# Patient Record
Sex: Male | Born: 1935
Health system: Southern US, Community
[De-identification: ages and names within clinical notes are randomized; demographics above are authoritative.]

## PROBLEM LIST (undated history)

## (undated) DIAGNOSIS — E785 Hyperlipidemia, unspecified: Secondary | ICD-10-CM

## (undated) DIAGNOSIS — I2510 Atherosclerotic heart disease of native coronary artery without angina pectoris: Secondary | ICD-10-CM

## (undated) DIAGNOSIS — I219 Acute myocardial infarction, unspecified: Secondary | ICD-10-CM

## (undated) HISTORY — PX: HERNIA REPAIR: SHX51

## (undated) HISTORY — PX: CORONARY ARTERY BYPASS GRAFT: SHX141

## (undated) HISTORY — DX: Hyperlipidemia, unspecified: E78.5

## (undated) HISTORY — DX: Acute myocardial infarction, unspecified: I21.9

## (undated) HISTORY — DX: Atherosclerotic heart disease of native coronary artery without angina pectoris: I25.10

## (undated) HISTORY — PX: VALVE REPLACEMENT: SUR13

---

## 2004-09-15 HISTORY — PX: CORONARY ANGIOPLASTY: SHX604

## 2018-11-11 DIAGNOSIS — E7800 Pure hypercholesterolemia, unspecified: Secondary | ICD-10-CM | POA: Diagnosis present

## 2018-11-11 DIAGNOSIS — Z952 Presence of prosthetic heart valve: Secondary | ICD-10-CM

## 2018-11-11 DIAGNOSIS — K219 Gastro-esophageal reflux disease without esophagitis: Secondary | ICD-10-CM | POA: Diagnosis present

## 2018-12-01 NOTE — Progress Notes (Deleted)
Patient referred by Gaspar Garbe, MD for ***  Subjective:   Shaun Brown, male    DOB: [birth year], 85 y.o.   MRN: [id number]   No chief complaint on file.   *** HPI  85 y.o. *** male with ***  *** No past medical history on file.  *** *** The histories are not reviewed yet. Please review them in the "History" navigator section and refresh this SmartLink.  *** Social History   Socioeconomic History  . Marital status: Not on file    Spouse name: Not on file  . Number of children: Not on file  . Years of education: Not on file  . Highest education level: Not on file  Occupational History  . Not on file  Social Needs  . Financial resource strain: Not on file  . Food insecurity:    Worry: Not on file    Inability: Not on file  . Transportation needs:    Medical: Not on file    Non-medical: Not on file  Tobacco Use  . Smoking status: Not on file  Substance and Sexual Activity  . Alcohol use: Not on file  . Drug use: Not on file  . Sexual activity: Not on file  Lifestyle  . Physical activity:    Days per week: Not on file    Minutes per session: Not on file  . Stress: Not on file  Relationships  . Social connections:    Talks on phone: Not on file    Gets together: Not on file    Attends religious service: Not on file    Active member of club or organization: Not on file    Attends meetings of clubs or organizations: Not on file    Relationship status: Not on file  . Intimate partner violence:    Fear of current or ex partner: Not on file    Emotionally abused: Not on file    Physically abused: Not on file    Forced sexual activity: Not on file  Other Topics Concern  . Not on file  Social History Narrative  . Not on file    *** No current outpatient medications on file prior to visit.   No current facility-administered medications on file prior to visit.     Cardiovascular studies:  ***  *** Recent labs:  ***  Review of Systems   Constitution: Negative for decreased appetite, malaise/fatigue, weight gain and weight loss.  HENT: Negative for congestion.   Eyes: Negative for visual disturbance.  Cardiovascular: Negative for chest pain, dyspnea on exertion, leg swelling, palpitations and syncope.  Respiratory: Negative for shortness of breath.   Endocrine: Negative for cold intolerance.  Hematologic/Lymphatic: Does not bruise/bleed easily.  Skin: Negative for itching and rash.  Musculoskeletal: Negative for myalgias.  Gastrointestinal: Negative for abdominal pain, nausea and vomiting.  Genitourinary: Negative for dysuria.  Neurological: Negative for dizziness and weakness.  Psychiatric/Behavioral: The patient is not nervous/anxious.   All other systems reviewed and are negative.       *** There were no vitals filed for this visit.  Objective:   Physical Exam  Constitutional: He is oriented to person, place, and time. He appears well-developed and well-nourished. No distress.  HENT:  Head: Normocephalic and atraumatic.  Eyes: Pupils are equal, round, and reactive to light. Conjunctivae are normal.  Neck: No JVD present.  Cardiovascular: Normal rate, regular rhythm and intact distal pulses.  Pulmonary/Chest: Effort normal and breath sounds normal. He  has no wheezes. He has no rales.  Abdominal: Soft. Bowel sounds are normal. There is no rebound.  Musculoskeletal:        General: No edema.  Lymphadenopathy:    He has no cervical adenopathy.  Neurological: He is alert and oriented to person, place, and time. No cranial nerve deficit.  Skin: Skin is warm and dry.  Psychiatric: He has a normal mood and affect.  Nursing note and vitals reviewed.         Assessment & Recommendations:   ***  There are no diagnoses linked to this encounter.   Thank you for referring the patient to [place]. Please feel free to contact with any questions.  Elder Negus, MD [facility] Pager:  [phone] Office: [phone] If no answer Cell [phone]

## 2018-12-02 ENCOUNTER — Ambulatory Visit: Payer: Self-pay | Admitting: Cardiology

## 2018-12-20 ENCOUNTER — Ambulatory Visit: Payer: Self-pay | Admitting: Cardiology

## 2018-12-20 ENCOUNTER — Telehealth: Payer: Self-pay | Admitting: Cardiology

## 2018-12-20 ENCOUNTER — Other Ambulatory Visit: Payer: Self-pay

## 2018-12-20 NOTE — Telephone Encounter (Signed)
Patient's visit today was rescheduled, given his exposure to COVID 19. We will try to obtain his records from the [facility] clinic, where he underwent aortic valve replacement and ?ascneding aorta repair two years ago. I offered the patient the option of virtual visit which he was agreeable to. We will then set up a virtual visit, followed by in person visit down the road.   Elder Negus, MD

## 2018-12-20 NOTE — Progress Notes (Deleted)
.  mp    Patient referred by Gaspar Garbe, MD for ***  Subjective:   Shaun Brown, male    DOB: 07-30-1932, 85 y.o.   MRN: [id number]   No chief complaint on file.   *** HPI  85 y.o. *** male s/p bioprosthetic aortic valve replacement and aortic aneurysm repair in ***, CAD s/p stents, hypertension, hyperlipidemia, h/o head and neck cancer s/p prior surgery, adenomatous colon polyp, GERD, here to establish cardiac care.   *** No past medical history on file.  *** *** The histories are not reviewed yet. Please review them in the "History" navigator section and refresh this SmartLink.  *** Social History   Socioeconomic History  . Marital status: Not on file    Spouse name: Not on file  . Number of children: Not on file  . Years of education: Not on file  . Highest education level: Not on file  Occupational History  . Not on file  Social Needs  . Financial resource strain: Not on file  . Food insecurity:    Worry: Not on file    Inability: Not on file  . Transportation needs:    Medical: Not on file    Non-medical: Not on file  Tobacco Use  . Smoking status: Not on file  Substance and Sexual Activity  . Alcohol use: Not on file  . Drug use: Not on file  . Sexual activity: Not on file  Lifestyle  . Physical activity:    Days per week: Not on file    Minutes per session: Not on file  . Stress: Not on file  Relationships  . Social connections:    Talks on phone: Not on file    Gets together: Not on file    Attends religious service: Not on file    Active member of club or organization: Not on file    Attends meetings of clubs or organizations: Not on file    Relationship status: Not on file  . Intimate partner violence:    Fear of current or ex partner: Not on file    Emotionally abused: Not on file    Physically abused: Not on file    Forced sexual activity: Not on file  Other Topics Concern  . Not on file  Social History Narrative  . Not on file     *** No current outpatient medications on file prior to visit.   No current facility-administered medications on file prior to visit.     Cardiovascular studies:  ***  *** Recent labs: ***  ROS      *** There were no vitals filed for this visit.  Objective:   Physical Exam        Assessment & Recommendations:   ***  ***   Thank you for referring the patient to [place]. Please feel free to contact with any questions.  Elder Negus, MD [facility] Pager: [phone] Office: [phone] If no answer Cell [phone]

## 2019-01-05 ENCOUNTER — Ambulatory Visit (INDEPENDENT_AMBULATORY_CARE_PROVIDER_SITE_OTHER): Payer: Medicare Other | Admitting: Cardiology

## 2019-01-05 ENCOUNTER — Encounter: Payer: Self-pay | Admitting: Cardiology

## 2019-01-05 ENCOUNTER — Other Ambulatory Visit: Payer: Self-pay

## 2019-01-05 VITALS — BP 130/78 | HR 65 | Ht 69.0 in | Wt 185.0 lb

## 2019-01-05 DIAGNOSIS — Z952 Presence of prosthetic heart valve: Secondary | ICD-10-CM

## 2019-01-05 DIAGNOSIS — I2510 Atherosclerotic heart disease of native coronary artery without angina pectoris: Secondary | ICD-10-CM | POA: Diagnosis not present

## 2019-01-06 ENCOUNTER — Encounter: Payer: Self-pay | Admitting: Cardiology

## 2019-01-06 DIAGNOSIS — I2510 Atherosclerotic heart disease of native coronary artery without angina pectoris: Secondary | ICD-10-CM | POA: Insufficient documentation

## 2019-01-06 DIAGNOSIS — Z952 Presence of prosthetic heart valve: Secondary | ICD-10-CM | POA: Insufficient documentation

## 2019-01-06 NOTE — Progress Notes (Signed)
Virtual Visit via Video Note   Subjective:   Shaun Brown, male    DOB: [birth year], 85 y.o.   MRN: [id number]   I connected with the patient on 01/06/19 by a video enabled telemedicine application and verified that I am speaking with the correct person using two identifiers.     I discussed the limitations of evaluation and management by telemedicine and the availability of in person appointments. The patient expressed understanding and agreed to proceed.   This visit type was conducted due to national recommendations for restrictions regarding the COVID-19 Pandemic (e.g. social distancing).  This format is felt to be most appropriate for this patient at this time.  All issues noted in this document were discussed and addressed.  No physical exam was performed (except for noted visual exam findings with Tele health visits).  The patient has consented to conduct a Tele health visit and understands insurance will be billed.     Chief complaint:  S/p AVR   HPI  85 y/o Caucasian male with hyperlipidemia, bioprosthetic aortic valve replacement and aortic aneurysm repair in 2017 [facility], [place]), coronary artery disease s/p stent for MI in 2006, h/o head and neck cancer s/p chemotherapy & radiation in 2000s, GERD, adenomatous polyp, here to establish cardiac care with [place].   Patient is orginally from [place], moved to [place] after retirement. He recently moved to [place] in 09/2018. He is physically active, walks 2-3 miles everyday. He also manages a sizeable vegetable garden in his backyard. With this level of activity, he denies chest pain, shortness of breath, palpitations, leg edema, orthopnea, PND, TIA/syncope. He occasionally has sensation of "liquid staying in the stomach" after his meals, but denies regurgitation.His only ongoing complaint is his pending dental work, which has been postponed due to the COVID pandemic. He knows to use antibiotic prophylaxis before  dental work.   His records from [facility], [place] are still awaited from PCP office.   Past Medical History:  Diagnosis Date  . CAD (coronary artery disease)   . Hyperlipidemia   . MI (myocardial infarction) [facility])      Past Surgical History:  Procedure Laterality Date  . CORONARY ANGIOPLASTY  2006  . CORONARY ARTERY BYPASS GRAFT    . HERNIA REPAIR    . VALVE REPLACEMENT       Social History   Socioeconomic History  . Marital status: Married    Spouse name: Not on file  . Number of children: Not on file  . Years of education: Not on file  . Highest education level: Not on file  Occupational History  . Not on file  Social Needs  . Financial resource strain: Not on file  . Food insecurity:    Worry: Not on file    Inability: Not on file  . Transportation needs:    Medical: Not on file    Non-medical: Not on file  Tobacco Use  . Smoking status: Former Smoker    Types: Cigarettes, Pipe    Last attempt to quit: 01/04/2001    Years since quitting: 18.0  . Smokeless tobacco: Never Used  Substance and Sexual Activity  . Alcohol use: Not on file  . Drug use: Not on file  . Sexual activity: Not on file  Lifestyle  . Physical activity:    Days per week: Not on file    Minutes per session: Not on file  . Stress: Not on file  Relationships  . Social  connections:    Talks on phone: Not on file    Gets together: Not on file    Attends religious service: Not on file    Active member of club or organization: Not on file    Attends meetings of clubs or organizations: Not on file    Relationship status: Not on file  . Intimate partner violence:    Fear of current or ex partner: Not on file    Emotionally abused: Not on file    Physically abused: Not on file    Forced sexual activity: Not on file  Other Topics Concern  . Not on file  Social History Narrative  . Not on file     Family History  Problem Relation Age of Onset  . Diabetes Mother   . Arthritis  Mother   . Leukemia Father   . Hypertension Father   . Non-Hodgkin's lymphoma Sister   . Prostate cancer Brother      Current Outpatient Medications on File Prior to Visit  Medication Sig Dispense Refill  . aspirin EC 81 MG tablet Take 81 mg by mouth daily.    Marland Kitchen atorvastatin (LIPITOR) 40 MG tablet Take 40 mg by mouth daily.    Marland Kitchen b complex vitamins tablet Take 1 tablet by mouth daily.    . Calcium Carbonate (CALCIUM 600 PO) Take by mouth.    . Cholecalciferol (D3-1000) 25 MCG (1000 UT) capsule Take 3,000 Units by mouth daily.    Marland Kitchen omeprazole (PRILOSEC) 20 MG capsule Take 20 mg by mouth daily.     No current facility-administered medications on file prior to visit.     Cardiovascular studies:  Awaited   Recent labs: Awaited  Review of Systems  Constitution: Negative for decreased appetite, malaise/fatigue, weight gain and weight loss.  HENT: Negative for congestion.   Eyes: Negative for visual disturbance.  Cardiovascular: Negative for chest pain, dyspnea on exertion, leg swelling, palpitations and syncope.  Respiratory: Negative for shortness of breath.   Endocrine: Negative for cold intolerance.  Hematologic/Lymphatic: Does not bruise/bleed easily.  Skin: Negative for itching and rash.  Musculoskeletal: Negative for myalgias.  Gastrointestinal: Negative for abdominal pain, nausea and vomiting.  Genitourinary: Negative for dysuria.  Neurological: Negative for dizziness and weakness.  Psychiatric/Behavioral: The patient is not nervous/anxious.   All other systems reviewed and are negative.        Vitals:   01/05/19 1309  BP: 130/78  Pulse: 65   (Measured by the patient using a home BP monitor)   Observation/findings during video visit   Objective:    Physical Exam  Constitutional: He is oriented to person, place, and time. He appears well-developed and well-nourished. No distress.  Neck: No JVD present.  Pulmonary/Chest: Effort normal.  Musculoskeletal:         General: No edema.  Neurological: He is alert and oriented to person, place, and time.  Psychiatric: He has a normal mood and affect.  Nursing note and vitals reviewed.         Assessment & Recommendations:   85 y/o Caucasian male with hyperlipidemia, bioprosthetic aortic valve replacement and aortic aneurysm repair in 2017 [facility], [place]), coronary artery disease s/p stent for MI in 2006, h/o head and neck cancer s/p chemotherapy & radiation in 2000s, GERD, adenomatous polyp, here to establish cardiac care with [place].   1. S/P AVR (aortic valve replacement) and aortoplasty Clinically stable. Will obtain surveillance echocardiogram in 03/2019. His HR and BP are well controlled.  Reasonable not to be on ARB/BB. [facility].  2. Coronary artery disease involving native coronary artery of native heart without angina pectoris Clinically stable. Continue aspirin, statin.   Follow up in 06/2019  Elder NegusManish J Patwardhan, MD [facility] Pager: [phone] Office: [phone] If no answer Cell [phone]

## 2019-04-11 ENCOUNTER — Ambulatory Visit (INDEPENDENT_AMBULATORY_CARE_PROVIDER_SITE_OTHER): Payer: Medicare Other

## 2019-04-11 ENCOUNTER — Other Ambulatory Visit: Payer: Self-pay

## 2019-04-11 DIAGNOSIS — Z952 Presence of prosthetic heart valve: Secondary | ICD-10-CM

## 2019-04-11 DIAGNOSIS — I2510 Atherosclerotic heart disease of native coronary artery without angina pectoris: Secondary | ICD-10-CM

## 2019-05-19 ENCOUNTER — Other Ambulatory Visit: Payer: Self-pay

## 2019-05-19 ENCOUNTER — Encounter (INDEPENDENT_AMBULATORY_CARE_PROVIDER_SITE_OTHER): Payer: Medicare Other | Admitting: Ophthalmology

## 2019-05-19 DIAGNOSIS — I10 Essential (primary) hypertension: Secondary | ICD-10-CM | POA: Diagnosis not present

## 2019-05-19 DIAGNOSIS — H34832 Tributary (branch) retinal vein occlusion, left eye, with macular edema: Secondary | ICD-10-CM | POA: Diagnosis not present

## 2019-05-19 DIAGNOSIS — H43813 Vitreous degeneration, bilateral: Secondary | ICD-10-CM | POA: Diagnosis not present

## 2019-05-19 DIAGNOSIS — H35033 Hypertensive retinopathy, bilateral: Secondary | ICD-10-CM

## 2019-07-08 ENCOUNTER — Encounter: Payer: Self-pay | Admitting: Cardiology

## 2019-07-11 ENCOUNTER — Other Ambulatory Visit: Payer: Self-pay

## 2019-07-11 ENCOUNTER — Ambulatory Visit (INDEPENDENT_AMBULATORY_CARE_PROVIDER_SITE_OTHER): Payer: Medicare Other | Admitting: Cardiology

## 2019-07-11 ENCOUNTER — Encounter: Payer: Self-pay | Admitting: Cardiology

## 2019-07-11 VITALS — BP 154/91 | HR 62 | Ht 69.0 in | Wt 191.5 lb

## 2019-07-11 DIAGNOSIS — I77810 Thoracic aortic ectasia: Secondary | ICD-10-CM | POA: Diagnosis not present

## 2019-07-11 DIAGNOSIS — Z952 Presence of prosthetic heart valve: Secondary | ICD-10-CM

## 2019-07-11 DIAGNOSIS — I2510 Atherosclerotic heart disease of native coronary artery without angina pectoris: Secondary | ICD-10-CM | POA: Diagnosis not present

## 2019-07-11 DIAGNOSIS — R0989 Other specified symptoms and signs involving the circulatory and respiratory systems: Secondary | ICD-10-CM | POA: Diagnosis not present

## 2019-07-11 NOTE — Progress Notes (Signed)
Subjective:   Shaun Brown, male    DOB: [birth year], 85 y.o.   MRN: [id number]  Chief complaint:  S/p AVR   HPI  85 y/o Caucasian male with hyperlipidemia, bioprosthetic aortic valve replacement (Carpentier-Edwards Perimount prosthetic valve size #25) and aortic aneurysm repair in 2017 [facility], [place]), coronary artery disease s/p stent for MI in 2006, history of postop A. fib with no recurrence,h/o head and neck cancer s/p chemotherapy & radiation in 2000s, GERD, adenomatous polyp.  He is doing well without any chest pain, shortness of breath, palpitations, leg edema, orthopnea, PND, TIA/syncope.  Blood pressure elevated today, but always 110-120/70-80 at home.  Recent echocardiogram shows normal functioning of bioprosthetic aortic valve.  Aortic root is stable at 4.3 cm.  Further review of records from [facility] [place] shows that he did have postop atrial fibrillation with no recurrence.  He has never been on anticoagulation.    Past Medical History:  Diagnosis Date  . CAD (coronary artery disease)   . Hyperlipidemia   . MI (myocardial infarction) [facility])      Past Surgical History:  Procedure Laterality Date  . CORONARY ANGIOPLASTY  2006  . CORONARY ARTERY BYPASS GRAFT    . HERNIA REPAIR    . VALVE REPLACEMENT       Social History   Socioeconomic History  . Marital status: Married    Spouse name: Not on file  . Number of children: 5  . Years of education: Not on file  . Highest education level: Not on file  Occupational History  . Not on file  Social Needs  . Financial resource strain: Not on file  . Food insecurity    Worry: Not on file    Inability: Not on file  . Transportation needs    Medical: Not on file    Non-medical: Not on file  Tobacco Use  . Smoking status: Former Smoker    Years: 20.00    Types: Pipe    Quit date: 01/04/2001    Years since quitting: 18.5  . Smokeless tobacco: Former Systems developer    Types: [place] date:  01/04/2001  Substance and Sexual Activity  . Alcohol use: Yes    Comment: occ  . Drug use: Not on file  . Sexual activity: Not on file  Lifestyle  . Physical activity    Days per week: Not on file    Minutes per session: Not on file  . Stress: Not on file  Relationships  . Social Herbalist on phone: Not on file    Gets together: Not on file    Attends religious service: Not on file    Active member of club or organization: Not on file    Attends meetings of clubs or organizations: Not on file    Relationship status: Not on file  . Intimate partner violence    Fear of current or ex partner: Not on file    Emotionally abused: Not on file    Physically abused: Not on file    Forced sexual activity: Not on file  Other Topics Concern  . Not on file  Social History Narrative  . Not on file     Family History  Problem Relation Age of Onset  . Diabetes Mother   . Arthritis Mother   . Leukemia Father   . Hypertension Father   . Non-Hodgkin's lymphoma Sister   . Prostate cancer Brother  Current Outpatient Medications on File Prior to Visit  Medication Sig Dispense Refill  . aspirin EC 81 MG tablet Take 81 mg by mouth daily.    Marland Kitchen atorvastatin (LIPITOR) 40 MG tablet Take 40 mg by mouth daily.    Marland Kitchen b complex vitamins tablet Take 1 tablet by mouth daily.    . Calcium Carbonate (CALCIUM 600 PO) Take by mouth.    . Cholecalciferol (D3-1000) 25 MCG (1000 UT) capsule Take 3,000 Units by mouth daily.    Marland Kitchen omeprazole (PRILOSEC) 20 MG capsule Take 20 mg by mouth daily.     No current facility-administered medications on file prior to visit.     Cardiovascular studies:  EKG 07/11/2019: Sinus rhythm 58 bpm. Left anterior fascicular block.  Old anteroseptal infarct. Poor R wave progression.   Echocardiogram 04/11/2019 :  1. Normal LV systolic function with EF 58%. Left ventricle cavity is normal in size. Moderate concentric hypertrophy of the left ventricle.  Normal global wall motion. Normal diastolic filling pattern. Calculated EF 58%. 2. Left atrial cavity is moderate to severely dilated at 4.4 cm. Left atrium is larger by volume in 4 chamber view. 3. Bioprosthetic aortic valve. No aortic valve regurgitation noted. Normal aortic valve leaflet mobility. Aortic valve peak pressure gradient of 15 and mean gradient of 8 mmHg, calculated aortic valve area 2.32 cm. 4. MV appears normal. Mild mitral annular calcification. Mild to moderate mitral regurgitation. 5. Mild tricuspid regurgitation. No evidence of pulmonary hypertension. 6. Aortic root diameter 4.3 cm.   Nuclear stress test 2016: Normal   Recent labs: 05/19/2019: Glucose 101.  BUN/creatinine 16/1.1.  eGFR 63.  Sodium 140, potassium 4.1.  Rest of the CMP normal. H/H 13/42.  MCV 93.  Platelets 143.   Cholesterol 125, triglycerides 53, HDL 42, LDL 72. Apolipoprotein B 52, normal   Review of Systems  Constitution: Negative for decreased appetite, malaise/fatigue, weight gain and weight loss.  HENT: Negative for congestion.   Eyes: Negative for visual disturbance.  Cardiovascular: Negative for chest pain, dyspnea on exertion, leg swelling, palpitations and syncope.  Respiratory: Negative for shortness of breath.   Endocrine: Negative for cold intolerance.  Hematologic/Lymphatic: Does not bruise/bleed easily.  Skin: Negative for itching and rash.  Musculoskeletal: Negative for myalgias.  Gastrointestinal: Negative for abdominal pain, nausea and vomiting.  Genitourinary: Negative for dysuria.  Neurological: Negative for dizziness and weakness.  Psychiatric/Behavioral: The patient is not nervous/anxious.   All other systems reviewed and are negative.        Vitals:   07/11/19 1122 07/11/19 1128  BP: (!) 160/90 (!) 154/91  Pulse: 76 62  SpO2: 95% 98%      Objective:    Physical Exam  Constitutional: He is oriented to person, place, and time. He appears well-developed and  well-nourished. No distress.  Neck: No JVD present.  Pulmonary/Chest: Effort normal.  Musculoskeletal:        General: No edema.  Neurological: He is alert and oriented to person, place, and time.  Psychiatric: He has a normal mood and affect.  Nursing note and vitals reviewed.         Assessment & Recommendations:   85 y/o Caucasian male with hyperlipidemia, bioprosthetic aortic valve replacement and aortic aneurysm repair in 2017 [facility], [place]), coronary artery disease s/p stent for MI in 2006, h/o head and neck cancer s/p chemotherapy & radiation in 2000s, GERD, adenomatous polyp.   S/P AVR (aortic valve replacement) and aortoplasty Clinically stable.  Echocardiogram in 03/2019  showed normal functioning of bioprosthetic aortic valve.  Aortic root dilatation: Status post ascending aorta aneurysm repair.  Recommend repeat echocardiogram in 1 year.  Heart rate well controlled.  Ideally, would like blood pressure to be better controlled.  H/O post op Afib: In 2017.  No recurrence of A. fib.  Reasonable to omit anticoagulation.  Coronary artery disease involving native coronary artery of native heart without angina pectoris Clinically stable. Continue aspirin, statin.   Elevated blood pressure without diagnosis of hypertension: Blood pressure elevated today.  Usually always normal at home.  I have encouraged him to regularly monitor his blood pressure at home and contact us if it stays > 140/80.  Left carotid bruit: Will obtain carotid ultrasound.  Nigel Mormon, MD [facility] Pager: [phone] Office: [phone] If no answer Cell [phone]

## 2019-08-02 ENCOUNTER — Other Ambulatory Visit: Payer: Self-pay

## 2019-08-02 ENCOUNTER — Ambulatory Visit (INDEPENDENT_AMBULATORY_CARE_PROVIDER_SITE_OTHER): Payer: Medicare Other

## 2019-08-02 DIAGNOSIS — R0989 Other specified symptoms and signs involving the circulatory and respiratory systems: Secondary | ICD-10-CM | POA: Diagnosis not present

## 2019-08-06 ENCOUNTER — Other Ambulatory Visit: Payer: Self-pay | Admitting: Cardiology

## 2019-08-06 DIAGNOSIS — I6522 Occlusion and stenosis of left carotid artery: Secondary | ICD-10-CM

## 2019-08-08 NOTE — Progress Notes (Signed)
Pt aware.

## 2019-09-19 ENCOUNTER — Encounter (INDEPENDENT_AMBULATORY_CARE_PROVIDER_SITE_OTHER): Payer: Medicare Other | Admitting: Ophthalmology

## 2019-09-19 DIAGNOSIS — H43813 Vitreous degeneration, bilateral: Secondary | ICD-10-CM

## 2019-09-19 DIAGNOSIS — H34832 Tributary (branch) retinal vein occlusion, left eye, with macular edema: Secondary | ICD-10-CM | POA: Diagnosis not present

## 2019-09-19 DIAGNOSIS — H353121 Nonexudative age-related macular degeneration, left eye, early dry stage: Secondary | ICD-10-CM

## 2019-09-19 DIAGNOSIS — I10 Essential (primary) hypertension: Secondary | ICD-10-CM

## 2019-09-19 DIAGNOSIS — H2513 Age-related nuclear cataract, bilateral: Secondary | ICD-10-CM

## 2019-09-19 DIAGNOSIS — H353112 Nonexudative age-related macular degeneration, right eye, intermediate dry stage: Secondary | ICD-10-CM

## 2019-09-19 DIAGNOSIS — H35033 Hypertensive retinopathy, bilateral: Secondary | ICD-10-CM | POA: Diagnosis not present

## 2019-10-09 ENCOUNTER — Ambulatory Visit: Payer: Medicare Other | Attending: Internal Medicine

## 2019-10-09 DIAGNOSIS — Z23 Encounter for immunization: Secondary | ICD-10-CM

## 2019-10-09 NOTE — Progress Notes (Signed)
   Covid-19 Vaccination Clinic  Name:  Shaun Brown    MRN: [id number] DOB: [birth year]  10/09/2019  [name] was observed post Covid-19 immunization for 15 minutes without incidence. He was provided with Vaccine Information Sheet and instruction to access the V-Safe system.   [name] was instructed to call 911 with any severe reactions post vaccine: Marland Kitchen Difficulty breathing  . Swelling of your face and throat  . A fast heartbeat  . A bad rash all over your body  . Dizziness and weakness    Immunizations Administered    Name Date Dose VIS Date Route   Pfizer COVID-19 Vaccine 10/09/2019 11:14 AM 0.3 mL 08/26/2019 Intramuscular   Manufacturer: [employer], [employer]   Lot: [id number]   NDC: [address]-1

## 2019-10-17 ENCOUNTER — Other Ambulatory Visit: Payer: Self-pay

## 2019-10-17 ENCOUNTER — Encounter (INDEPENDENT_AMBULATORY_CARE_PROVIDER_SITE_OTHER): Payer: Medicare Other | Admitting: Ophthalmology

## 2019-10-17 DIAGNOSIS — H43813 Vitreous degeneration, bilateral: Secondary | ICD-10-CM

## 2019-10-17 DIAGNOSIS — H2513 Age-related nuclear cataract, bilateral: Secondary | ICD-10-CM

## 2019-10-17 DIAGNOSIS — H35033 Hypertensive retinopathy, bilateral: Secondary | ICD-10-CM

## 2019-10-17 DIAGNOSIS — I10 Essential (primary) hypertension: Secondary | ICD-10-CM | POA: Diagnosis not present

## 2019-10-17 DIAGNOSIS — H353221 Exudative age-related macular degeneration, left eye, with active choroidal neovascularization: Secondary | ICD-10-CM | POA: Diagnosis not present

## 2019-10-31 ENCOUNTER — Ambulatory Visit: Payer: Medicare Other | Attending: Internal Medicine

## 2019-10-31 DIAGNOSIS — Z23 Encounter for immunization: Secondary | ICD-10-CM

## 2019-10-31 NOTE — Progress Notes (Signed)
.    Covid-19 Vaccination Clinic  Name:  Amante Fomby    MRN: [id number] DOB: [birth year]  10/31/2019  Mr. Knobel was observed post Covid-19 immunization for 15 minutes without incidence. He was provided with Vaccine Information Sheet and instruction to access the V-Safe system.   Mr. Riesgo was instructed to call 911 with any severe reactions post vaccine: Marland Kitchen Difficulty breathing  . Swelling of your face and throat  . A fast heartbeat  . A bad rash all over your body  . Dizziness and weakness    Immunizations Administered    Name Date Dose VIS Date Route   Pfizer COVID-19 Vaccine 10/31/2019  8:51 AM 0.3 mL 08/26/2019 Intramuscular   Manufacturer: [employer], [employer]   Lot: [id number]   NDC: [id number]

## 2019-11-14 ENCOUNTER — Encounter (INDEPENDENT_AMBULATORY_CARE_PROVIDER_SITE_OTHER): Payer: Medicare Other | Admitting: Ophthalmology

## 2019-11-14 ENCOUNTER — Other Ambulatory Visit: Payer: Self-pay

## 2019-11-14 DIAGNOSIS — H34832 Tributary (branch) retinal vein occlusion, left eye, with macular edema: Secondary | ICD-10-CM

## 2019-11-14 DIAGNOSIS — H43813 Vitreous degeneration, bilateral: Secondary | ICD-10-CM

## 2019-11-14 DIAGNOSIS — I10 Essential (primary) hypertension: Secondary | ICD-10-CM

## 2019-11-14 DIAGNOSIS — H35033 Hypertensive retinopathy, bilateral: Secondary | ICD-10-CM | POA: Diagnosis not present

## 2019-12-19 ENCOUNTER — Encounter (INDEPENDENT_AMBULATORY_CARE_PROVIDER_SITE_OTHER): Payer: Medicare Other | Admitting: Ophthalmology

## 2019-12-19 DIAGNOSIS — H35033 Hypertensive retinopathy, bilateral: Secondary | ICD-10-CM

## 2019-12-19 DIAGNOSIS — H34832 Tributary (branch) retinal vein occlusion, left eye, with macular edema: Secondary | ICD-10-CM

## 2019-12-19 DIAGNOSIS — I10 Essential (primary) hypertension: Secondary | ICD-10-CM

## 2019-12-19 DIAGNOSIS — H43813 Vitreous degeneration, bilateral: Secondary | ICD-10-CM

## 2019-12-19 DIAGNOSIS — H353111 Nonexudative age-related macular degeneration, right eye, early dry stage: Secondary | ICD-10-CM

## 2020-01-16 ENCOUNTER — Encounter (INDEPENDENT_AMBULATORY_CARE_PROVIDER_SITE_OTHER): Payer: Medicare Other | Admitting: Ophthalmology

## 2020-01-16 DIAGNOSIS — I10 Essential (primary) hypertension: Secondary | ICD-10-CM

## 2020-01-16 DIAGNOSIS — H35033 Hypertensive retinopathy, bilateral: Secondary | ICD-10-CM

## 2020-01-16 DIAGNOSIS — H34831 Tributary (branch) retinal vein occlusion, right eye, with macular edema: Secondary | ICD-10-CM | POA: Diagnosis not present

## 2020-01-16 DIAGNOSIS — H43813 Vitreous degeneration, bilateral: Secondary | ICD-10-CM

## 2020-02-16 ENCOUNTER — Encounter (INDEPENDENT_AMBULATORY_CARE_PROVIDER_SITE_OTHER): Payer: Medicare Other | Admitting: Ophthalmology

## 2020-02-16 ENCOUNTER — Other Ambulatory Visit: Payer: Self-pay

## 2020-02-16 DIAGNOSIS — H34832 Tributary (branch) retinal vein occlusion, left eye, with macular edema: Secondary | ICD-10-CM

## 2020-02-16 DIAGNOSIS — I10 Essential (primary) hypertension: Secondary | ICD-10-CM

## 2020-02-16 DIAGNOSIS — H35033 Hypertensive retinopathy, bilateral: Secondary | ICD-10-CM | POA: Diagnosis not present

## 2020-02-16 DIAGNOSIS — H43813 Vitreous degeneration, bilateral: Secondary | ICD-10-CM | POA: Diagnosis not present

## 2020-03-15 ENCOUNTER — Encounter (INDEPENDENT_AMBULATORY_CARE_PROVIDER_SITE_OTHER): Payer: Medicare Other | Admitting: Ophthalmology

## 2020-03-15 ENCOUNTER — Other Ambulatory Visit: Payer: Self-pay

## 2020-03-15 DIAGNOSIS — H34832 Tributary (branch) retinal vein occlusion, left eye, with macular edema: Secondary | ICD-10-CM | POA: Diagnosis not present

## 2020-03-15 DIAGNOSIS — H35033 Hypertensive retinopathy, bilateral: Secondary | ICD-10-CM | POA: Diagnosis not present

## 2020-03-15 DIAGNOSIS — H43813 Vitreous degeneration, bilateral: Secondary | ICD-10-CM

## 2020-03-15 DIAGNOSIS — I10 Essential (primary) hypertension: Secondary | ICD-10-CM

## 2020-04-11 ENCOUNTER — Other Ambulatory Visit: Payer: Self-pay

## 2020-04-11 ENCOUNTER — Encounter (INDEPENDENT_AMBULATORY_CARE_PROVIDER_SITE_OTHER): Payer: Medicare Other | Admitting: Ophthalmology

## 2020-04-11 DIAGNOSIS — H43813 Vitreous degeneration, bilateral: Secondary | ICD-10-CM | POA: Diagnosis not present

## 2020-04-11 DIAGNOSIS — H35033 Hypertensive retinopathy, bilateral: Secondary | ICD-10-CM

## 2020-04-11 DIAGNOSIS — H34832 Tributary (branch) retinal vein occlusion, left eye, with macular edema: Secondary | ICD-10-CM | POA: Diagnosis not present

## 2020-04-11 DIAGNOSIS — I10 Essential (primary) hypertension: Secondary | ICD-10-CM

## 2020-05-14 ENCOUNTER — Ambulatory Visit: Payer: Medicare Other | Admitting: Podiatry

## 2020-05-15 ENCOUNTER — Ambulatory Visit (INDEPENDENT_AMBULATORY_CARE_PROVIDER_SITE_OTHER): Payer: Medicare Other | Admitting: Podiatry

## 2020-05-15 ENCOUNTER — Encounter (INDEPENDENT_AMBULATORY_CARE_PROVIDER_SITE_OTHER): Payer: Medicare Other | Admitting: Ophthalmology

## 2020-05-15 ENCOUNTER — Other Ambulatory Visit: Payer: Self-pay

## 2020-05-15 DIAGNOSIS — I10 Essential (primary) hypertension: Secondary | ICD-10-CM | POA: Diagnosis not present

## 2020-05-15 DIAGNOSIS — H34832 Tributary (branch) retinal vein occlusion, left eye, with macular edema: Secondary | ICD-10-CM

## 2020-05-15 DIAGNOSIS — L84 Corns and callosities: Secondary | ICD-10-CM | POA: Diagnosis not present

## 2020-05-15 DIAGNOSIS — M79672 Pain in left foot: Secondary | ICD-10-CM | POA: Diagnosis not present

## 2020-05-15 DIAGNOSIS — M216X9 Other acquired deformities of unspecified foot: Secondary | ICD-10-CM | POA: Diagnosis not present

## 2020-05-15 DIAGNOSIS — H43813 Vitreous degeneration, bilateral: Secondary | ICD-10-CM

## 2020-05-15 DIAGNOSIS — H35033 Hypertensive retinopathy, bilateral: Secondary | ICD-10-CM | POA: Diagnosis not present

## 2020-05-15 DIAGNOSIS — H2513 Age-related nuclear cataract, bilateral: Secondary | ICD-10-CM

## 2020-05-24 NOTE — Progress Notes (Signed)
Subjective:   Patient ID: Shaun Brown, male   DOB: 86 y.o.   MRN: [id number]   HPI 86 year old male presents the office today for concerns of a large callus on the bottom of his left foot submetatarsal 3 where he has majority discomfort.  He generally gets calluses to the balls of his feet and gets pedicures regularly however he is not sure if last time did not get enough skin off but the calluses become thick causing discomfort.  Denies any open sores and denies any redness or drainage or any swelling.  He said no recent treatment otherwise.  Hurts to go barefoot.  No other concerns today.   Review of Systems  All other systems reviewed and are negative.  Past Medical History:  Diagnosis Date   CAD (coronary artery disease)    Hyperlipidemia    MI (myocardial infarction) (HCC)     Past Surgical History:  Procedure Laterality Date   CORONARY ANGIOPLASTY  2006   CORONARY ARTERY BYPASS GRAFT     HERNIA REPAIR     VALVE REPLACEMENT       Current Outpatient Medications:    aspirin EC 81 MG tablet, Take 81 mg by mouth daily., Disp: , Rfl:    atorvastatin (LIPITOR) 40 MG tablet, Take 40 mg by mouth daily., Disp: , Rfl:    b complex vitamins tablet, Take 1 tablet by mouth daily., Disp: , Rfl:    BESIVANCE 0.6 % SUSP, Apply to eye., Disp: , Rfl:    Calcium Carbonate (CALCIUM 600 PO), Take by mouth., Disp: , Rfl:    Cholecalciferol (D3-1000) 25 MCG (1000 UT) capsule, Take 3,000 Units by mouth daily., Disp: , Rfl:    omeprazole (PRILOSEC) 20 MG capsule, Take 20 mg by mouth daily., Disp: , Rfl:    Prednisol Ace-Moxiflox-Bromfen 1-0.5-0.075 % SUSP, Place 1 drop into the right eye 4 (four) times daily., Disp: , Rfl:    sildenafil (VIAGRA) 50 MG tablet, , Disp: , Rfl:   Allergies  Allergen Reactions   Tetanus Toxoids    Amoxicillin Rash        Objective:  Physical Exam  General: AAO x3, NAD  Dermatological: Hyperkeratotic tissue diffusely submetatarsal area  bilaterally however the majority of them are thicker.  Hyperkeratotic tissue submetatarsal 3.  Upon debridement there is no underlying ulceration drainage or any signs of infection.  No open lesions otherwise there is dry skin present to the plantar aspect of feet in general.  There is no deep skin fissures.  Vascular: Dorsalis Pedis artery and Posterior Tibial artery pedal pulses are 2/4 bilateral with immedate capillary fill time. There is no pain with calf compression, swelling, warmth, erythema.   Neruologic: Grossly intact via light touch bilateral.   Musculoskeletal: Prominent metatarsal heads plantarly with atrophy of the fat pad.  Muscular strength 5/5 in all groups tested bilateral.     Assessment:   Hyperkeratotic tissue, left foot pain     Plan:  -Treatment options discussed including all alternatives, risks, and complications -Etiology of symptoms were discussed -Debrided hyperkeratotic tissue with any complications or bleeding.  Recommended moisturizer applied moisturizers today.  Offloading pads dispensed.  Vivi Barrack DPM

## 2020-06-13 ENCOUNTER — Encounter (INDEPENDENT_AMBULATORY_CARE_PROVIDER_SITE_OTHER): Payer: Medicare Other | Admitting: Ophthalmology

## 2020-06-14 ENCOUNTER — Other Ambulatory Visit: Payer: Self-pay

## 2020-06-14 ENCOUNTER — Encounter (INDEPENDENT_AMBULATORY_CARE_PROVIDER_SITE_OTHER): Payer: Medicare Other | Admitting: Ophthalmology

## 2020-06-14 DIAGNOSIS — I10 Essential (primary) hypertension: Secondary | ICD-10-CM

## 2020-06-14 DIAGNOSIS — H35033 Hypertensive retinopathy, bilateral: Secondary | ICD-10-CM

## 2020-06-14 DIAGNOSIS — H43813 Vitreous degeneration, bilateral: Secondary | ICD-10-CM | POA: Diagnosis not present

## 2020-06-14 DIAGNOSIS — H34832 Tributary (branch) retinal vein occlusion, left eye, with macular edema: Secondary | ICD-10-CM

## 2020-06-14 DIAGNOSIS — H2513 Age-related nuclear cataract, bilateral: Secondary | ICD-10-CM

## 2020-06-19 ENCOUNTER — Ambulatory Visit: Payer: Medicare Other

## 2020-06-19 ENCOUNTER — Other Ambulatory Visit: Payer: Self-pay

## 2020-06-19 DIAGNOSIS — Z952 Presence of prosthetic heart valve: Secondary | ICD-10-CM

## 2020-06-19 DIAGNOSIS — I77810 Thoracic aortic ectasia: Secondary | ICD-10-CM

## 2020-06-19 DIAGNOSIS — I6522 Occlusion and stenosis of left carotid artery: Secondary | ICD-10-CM

## 2020-06-21 NOTE — Progress Notes (Signed)
Agree 

## 2020-06-21 NOTE — Progress Notes (Signed)
Your patient. Maybe stop evaluating further? 87 and stable unless good candidate for endarterectomy in future

## 2020-07-12 ENCOUNTER — Encounter (INDEPENDENT_AMBULATORY_CARE_PROVIDER_SITE_OTHER): Payer: Medicare Other | Admitting: Ophthalmology

## 2020-07-12 ENCOUNTER — Other Ambulatory Visit: Payer: Self-pay

## 2020-07-12 DIAGNOSIS — H43813 Vitreous degeneration, bilateral: Secondary | ICD-10-CM

## 2020-07-12 DIAGNOSIS — H34832 Tributary (branch) retinal vein occlusion, left eye, with macular edema: Secondary | ICD-10-CM | POA: Diagnosis not present

## 2020-07-12 DIAGNOSIS — I10 Essential (primary) hypertension: Secondary | ICD-10-CM

## 2020-07-12 DIAGNOSIS — H35033 Hypertensive retinopathy, bilateral: Secondary | ICD-10-CM

## 2020-07-16 ENCOUNTER — Ambulatory Visit: Payer: Medicare Other | Admitting: Cardiology

## 2020-07-16 ENCOUNTER — Encounter: Payer: Self-pay | Admitting: Cardiology

## 2020-07-16 ENCOUNTER — Other Ambulatory Visit: Payer: Self-pay

## 2020-07-16 VITALS — BP 148/87 | HR 64 | Resp 16 | Ht 69.0 in | Wt 194.0 lb

## 2020-07-16 DIAGNOSIS — I2510 Atherosclerotic heart disease of native coronary artery without angina pectoris: Secondary | ICD-10-CM

## 2020-07-16 DIAGNOSIS — I6522 Occlusion and stenosis of left carotid artery: Secondary | ICD-10-CM | POA: Insufficient documentation

## 2020-07-16 DIAGNOSIS — Z8679 Personal history of other diseases of the circulatory system: Secondary | ICD-10-CM | POA: Insufficient documentation

## 2020-07-16 DIAGNOSIS — I4891 Unspecified atrial fibrillation: Secondary | ICD-10-CM | POA: Insufficient documentation

## 2020-07-16 DIAGNOSIS — R0989 Other specified symptoms and signs involving the circulatory and respiratory systems: Secondary | ICD-10-CM

## 2020-07-16 DIAGNOSIS — I480 Paroxysmal atrial fibrillation: Secondary | ICD-10-CM

## 2020-07-16 DIAGNOSIS — Z952 Presence of prosthetic heart valve: Secondary | ICD-10-CM

## 2020-07-16 NOTE — Progress Notes (Signed)
Subjective:   Shaun Brown, male    DOB: 03-14-1932, 86 y.o.   MRN: [id number]  Chief complaint:  S/p AVR   HPI  86 y/o Caucasian male with hyperlipidemia, bioprosthetic aortic valve replacement (Carpentier-Edwards Perimount prosthetic valve size #25) and aortic aneurysm repair in 2017 [facility], [place]), coronary artery disease s/p stent for MI in 2006, history of postop A. fib with no recurrence,h/o head and neck cancer s/p chemotherapy & radiation in 2000s, GERD, adenomatous polyp.  Patient is doing well and denies chest pain, shortness of breath, palpitations, leg edema, orthopnea, PND, TIA/syncope. Blood pressure elevated today, like it usually is, at doctors visits. Home BP much lower than this.   Recent echocardiogram reviewed with the patient.  Notable for left atrium enlargement.  Patient reports that he did have brief atrial fibrillation perioperatively.  He was started on warfarin then, but was subsequently stopped.  This was at [facility] clinic in [place] in 2017.    Current Outpatient Medications on File Prior to Visit  Medication Sig Dispense Refill  . aspirin EC 81 MG tablet Take 81 mg by mouth daily.    Marland Kitchen atorvastatin (LIPITOR) 40 MG tablet Take 40 mg by mouth daily.    Marland Kitchen b complex vitamins tablet Take 1 tablet by mouth daily.    Marland Kitchen BESIVANCE 0.6 % SUSP Apply to eye.    . Calcium Carbonate (CALCIUM 600 PO) Take by mouth.    . Cholecalciferol (D3-1000) 25 MCG (1000 UT) capsule Take 3,000 Units by mouth daily.    Marland Kitchen omeprazole (PRILOSEC) 20 MG capsule Take 20 mg by mouth daily.    . Prednisol Ace-Moxiflox-Bromfen 1-0.5-0.075 % SUSP Place 1 drop into the right eye 4 (four) times daily.    . sildenafil (VIAGRA) 50 MG tablet      No current facility-administered medications on file prior to visit.    Cardiovascular studies:  EKG 07/16/2020: Sinus rhythm 68 bpm Right bundle branch block and left anterior fascicular block Occasional ectopic ventricular beat       Echocardiogram 06/19/2020:  Left ventricle cavity is normal in size. Moderate concentric hypertrophy  of the left ventricle. Abnormal septal wall motion due to post-operative  valve. Normal LV systolic function with EF 55%. Doppler evidence of grade  II (pseudonormal) diastolic dysfunction, elevated LAP.  S/p intact aneurysm repair.  Left atrial cavity is severely dilated.  Bioprosthetic aortic valve with normal functioning. Aortic valve mean  gradient of 14 mmHg, Vmax of 2.5 m/s. Calculated aortic valve area by  continuity equation is 1.3 cm. No aortic valve regurgitation noted.  Mildly restricted mitral valve leaflets without significant stenosis. Mild  (Grade I) mitral regurgitation.  Mild to moderate tricuspid regurgitation. Peak RA-RV gradient 21 mmHg.  No significant change compared to previous study on 04/11/2019.  Carotid artery duplex 06/19/2020:  Minimal stenosis in the right internal carotid artery (minimal).  Stenosis in the left internal carotid artery (16-49%).  Right vertebral artery flow is not visualized. Left vertebral artery flow  is not visualized.  Study is limited by severe immobility of the neck.  Follow up in one year is appropriate if clinically indicated. No  significant change from 08/02/2019.  Nuclear stress test 2016: Normal   Recent labs: 05/19/2019: Glucose 101.  BUN/creatinine 16/1.1.  eGFR 63.  Sodium 140, potassium 4.1.  Rest of the CMP normal. H/H 13/42.  MCV 93.  Platelets 143.   Cholesterol 125, triglycerides 53, HDL 42, LDL 72. Apolipoprotein B 52, normal  Review of Systems  Cardiovascular: Negative for chest pain, dyspnea on exertion, leg swelling, palpitations and syncope.         Vitals:   07/16/20 1005 07/16/20 1008  BP: (!) 152/143 (!) 148/87  Pulse: 60 64  Resp: 16   SpO2: 95%       Objective:    Physical Exam Vitals and nursing note reviewed.  Constitutional:      General: He is not in acute distress.     Appearance: He is well-developed.  Neck:     Vascular: No JVD.  Cardiovascular:     Heart sounds: Murmur heard.  Harsh early systolic murmur is present with a grade of 2/6 at the upper right sternal border radiating to the neck.   Pulmonary:     Effort: Pulmonary effort is normal.  Neurological:     Mental Status: He is alert and oriented to person, place, and time.           Assessment & Recommendations:   86 y/o Caucasian male with hyperlipidemia, bioprosthetic aortic valve replacement and aortic aneurysm repair in 2017 [facility], [place]), coronary artery disease s/p stent for MI in 2006, h/o head and neck cancer s/p chemotherapy & radiation in 2000s, GERD, adenomatous polyp.   S/P AVR (aortic valve replacement) and aortoplasty Clinically stable. Normally functioning of bioprosthetic aortic valve.  Aortic root dilatation: Status post ascending aorta aneurysm repair.  Recommend repeat echocardiogram in 1 year.  Heart rate well controlled.  Ideally, would like blood pressure to be better controlled.  H/O post op Afib: In 2017.  No recurrence of A. fib.   Since his echocardiogram showed severe LA enlargement, I will obtain 2 week monitor to evaluate for any paroxysmal afib. If he has Afib, he will nee anticoagulation.   Coronary artery disease involving native coronary artery of native heart without angina pectoris Clinically stable. Continue aspirin, statin.   Elevated blood pressure without diagnosis of hypertension: Blood pressure elevated today.  Usually always normal at home.  I have encouraged him to regularly monitor his blood pressure at home and contact us if it stays > 140/80.  F/u in 1 year, unless monitor showws Afib.   Nigel Mormon, MD [facility] Pager: [phone] Office: [phone] If no answer Cell [phone]

## 2020-08-06 ENCOUNTER — Encounter (INDEPENDENT_AMBULATORY_CARE_PROVIDER_SITE_OTHER): Payer: Medicare Other | Admitting: Ophthalmology

## 2020-08-06 ENCOUNTER — Other Ambulatory Visit: Payer: Self-pay

## 2020-08-06 DIAGNOSIS — H34832 Tributary (branch) retinal vein occlusion, left eye, with macular edema: Secondary | ICD-10-CM | POA: Diagnosis not present

## 2020-08-06 DIAGNOSIS — I10 Essential (primary) hypertension: Secondary | ICD-10-CM | POA: Diagnosis not present

## 2020-08-06 DIAGNOSIS — H35033 Hypertensive retinopathy, bilateral: Secondary | ICD-10-CM

## 2020-08-06 DIAGNOSIS — H43813 Vitreous degeneration, bilateral: Secondary | ICD-10-CM

## 2020-08-22 ENCOUNTER — Other Ambulatory Visit: Payer: Self-pay

## 2020-08-22 ENCOUNTER — Ambulatory Visit: Payer: Medicare Other

## 2020-08-22 DIAGNOSIS — Z8679 Personal history of other diseases of the circulatory system: Secondary | ICD-10-CM

## 2020-08-22 DIAGNOSIS — I480 Paroxysmal atrial fibrillation: Secondary | ICD-10-CM

## 2020-09-03 ENCOUNTER — Ambulatory Visit: Payer: Medicare Other | Admitting: Cardiology

## 2020-09-03 ENCOUNTER — Encounter (INDEPENDENT_AMBULATORY_CARE_PROVIDER_SITE_OTHER): Payer: Medicare Other | Admitting: Ophthalmology

## 2020-09-03 ENCOUNTER — Other Ambulatory Visit: Payer: Self-pay

## 2020-09-03 ENCOUNTER — Encounter: Payer: Self-pay | Admitting: Cardiology

## 2020-09-03 VITALS — BP 152/96 | HR 58 | Ht 69.0 in | Wt 194.0 lb

## 2020-09-03 DIAGNOSIS — H34832 Tributary (branch) retinal vein occlusion, left eye, with macular edema: Secondary | ICD-10-CM

## 2020-09-03 DIAGNOSIS — I10 Essential (primary) hypertension: Secondary | ICD-10-CM

## 2020-09-03 DIAGNOSIS — H43813 Vitreous degeneration, bilateral: Secondary | ICD-10-CM

## 2020-09-03 DIAGNOSIS — H35033 Hypertensive retinopathy, bilateral: Secondary | ICD-10-CM | POA: Diagnosis not present

## 2020-09-03 DIAGNOSIS — H2512 Age-related nuclear cataract, left eye: Secondary | ICD-10-CM

## 2020-09-03 MED ORDER — AMLODIPINE BESYLATE 5 MG PO TABS: 5.0000 mg | ORAL_TABLET | Freq: Every day | ORAL | 3 refills | Status: DC

## 2020-09-03 NOTE — Progress Notes (Signed)
Subjective:   Shaun Brown, male    DOB: 1932/02/15, 86 y.o.   MRN: [id number]  Chief complaint:  S/p AVR   HPI  86 y/o Caucasian male with hyperlipidemia, bioprosthetic aortic valve replacement (Carpentier-Edwards Perimount prosthetic valve size #25) and aortic aneurysm repair in 2017 [facility], [place]), coronary artery disease s/p stent for MI in 2006, history of postop A. fib with no recurrence,h/o head and neck cancer s/p chemotherapy & radiation in 2000s, GERD, adenomatous polyp.  Patient made appt today due to elevated blood pressure.  Systolic blood pressure has ranged from 140s to 160s.  Patient and wife do endorse high salt diet, including anchovies with pasta sauce for dinner last night.  Is currently not on any antihypertensive agents.  He recently wore Zio patch to evaluate for any A. fib given his prior history.  Report is pending.   Current Outpatient Medications on File Prior to Visit  Medication Sig Dispense Refill  . aspirin EC 81 MG tablet Take 81 mg by mouth daily.    Marland Kitchen atorvastatin (LIPITOR) 40 MG tablet Take 40 mg by mouth daily.    Marland Kitchen b complex vitamins tablet Take 1 tablet by mouth daily.    Marland Kitchen BESIVANCE 0.6 % SUSP Apply to eye.    . Calcium Carbonate (CALCIUM 600 PO) Take by mouth.    . Cholecalciferol (D3-1000) 25 MCG (1000 UT) capsule Take 3,000 Units by mouth daily.    . mupirocin ointment (BACTROBAN) 2 % Apply 1 application topically daily.    Marland Kitchen omeprazole (PRILOSEC) 20 MG capsule Take 20 mg by mouth daily.    . Prednisol Ace-Moxiflox-Bromfen 1-0.5-0.075 % SUSP Place 1 drop into the right eye 4 (four) times daily.    . sildenafil (VIAGRA) 50 MG tablet      No current facility-administered medications on file prior to visit.    Cardiovascular studies:  EKG 07/16/2020: Sinus rhythm 68 bpm Right bundle branch block and left anterior fascicular block Occasional ectopic ventricular beat      Echocardiogram 06/19/2020:  Left ventricle cavity is  normal in size. Moderate concentric hypertrophy  of the left ventricle. Abnormal septal wall motion due to post-operative  valve. Normal LV systolic function with EF 55%. Doppler evidence of grade  II (pseudonormal) diastolic dysfunction, elevated LAP.  S/p intact aneurysm repair.  Left atrial cavity is severely dilated.  Bioprosthetic aortic valve with normal functioning. Aortic valve mean  gradient of 14 mmHg, Vmax of 2.5 m/s. Calculated aortic valve area by  continuity equation is 1.3 cm. No aortic valve regurgitation noted.  Mildly restricted mitral valve leaflets without significant stenosis. Mild  (Grade I) mitral regurgitation.  Mild to moderate tricuspid regurgitation. Peak RA-RV gradient 21 mmHg.  No significant change compared to previous study on 04/11/2019.  Carotid artery duplex 06/19/2020:  Minimal stenosis in the right internal carotid artery (minimal).  Stenosis in the left internal carotid artery (16-49%).  Right vertebral artery flow is not visualized. Left vertebral artery flow  is not visualized.  Study is limited by severe immobility of the neck.  Follow up in one year is appropriate if clinically indicated. No  significant change from 08/02/2019.  Nuclear stress test 2016: Normal   Recent labs: 05/19/2019: Glucose 101.  BUN/creatinine 16/1.1.  eGFR 63.  Sodium 140, potassium 4.1.  Rest of the CMP normal. H/H 13/42.  MCV 93.  Platelets 143.   Cholesterol 125, triglycerides 53, HDL 42, LDL 72. Apolipoprotein B 52, normal   Review of  Systems  Cardiovascular: Negative for chest pain, dyspnea on exertion, leg swelling, palpitations and syncope.         Vitals:   09/03/20 1522 09/03/20 1529  BP: (!) 167/104 (!) 152/96  Pulse: (!) 57 (!) 58  SpO2: 97% 97%      Objective:    Physical Exam Vitals and nursing note reviewed.  Constitutional:      General: He is not in acute distress.    Appearance: He is well-developed.  Neck:     Vascular: No  JVD.  Cardiovascular:     Heart sounds: Murmur heard.   Harsh early systolic murmur is present with a grade of 2/6 at the upper right sternal border radiating to the neck.   Pulmonary:     Effort: Pulmonary effort is normal.  Neurological:     Mental Status: He is alert and oriented to person, place, and time.           Assessment & Recommendations:   86 y/o Caucasian male with hyperlipidemia, bioprosthetic aortic valve replacement and aortic aneurysm repair in 2017 [facility], [place]), coronary artery disease s/p stent for MI in 2006, h/o post-op Afib,  h/o head and neck cancer s/p chemotherapy & radiation in 2000s, GERD, adenomatous polyp.   Hypertension:  Recommended low-salt diet.  Started amlodipine 5 mg daily.  Encourage keeping blood pressure log at home.  If blood pressure remains elevated, could add lisinopril or hydrochlorothiazide.  S/P AVR (aortic valve replacement) and aortoplasty Clinically stable. Normally functioning of bioprosthetic aortic valve.  Aortic root dilatation: Status post ascending aorta aneurysm repair.  Recommend repeat echocardiogram in 1 year.  Heart rate well controlled.  Ideally, would like blood pressure to be better controlled.  H/O post op Afib: In 2017.  No recurrence of A. fib.   Zio patch report pending.  Coronary artery disease involving native coronary artery of native heart without angina pectoris Clinically stable. Continue aspirin, statin.   F/u in 1 year, unless monitor showws Afib.   Nigel Mormon, MD [facility] [place] Pager: [phone] Office: [phone] If no answer Cell [phone]

## 2020-09-17 ENCOUNTER — Telehealth: Payer: Self-pay | Admitting: Cardiology

## 2020-09-17 DIAGNOSIS — I455 Other specified heart block: Secondary | ICD-10-CM

## 2020-09-17 NOTE — Telephone Encounter (Signed)
  Mobile cardiac telemetry 12 days 08/22/2020 - 09/03/2020: Dominant rhythm: Sinus. HR 42-144 bpm. Avg HR 62 bpm, in sinus rhythm with IVCD. 3 pauses on 08/26/2020 within 15 secs, longest at 5.2 sec. No symptoms reported. 73 episodes of SVT/atrial tachycardia, fastest at 164 bpm for 4 beats, longest for 19 beats at 116 bpm.  1% isolated SVE, <1% couplet/triplets. 5 episodes of VT, fastest at 218 bpm for 8 beats, longest for 16 beats at 139 bpm. 3.3% isolated VE, 1.4% couplet, <1%triplets. No atrial fibrillation/atrial flutter. 0 patient triggered events.   Discussed the above findings with the patient. He mentions that he was exercising on 08/26/20 afternoon, denies nay syncope. Nonetheless, given the awake time pause of 5.2 sec, I will refer him for EP evaluation.   Elder Negus, MD Pager: [phone] Office: [phone]

## 2020-09-18 ENCOUNTER — Telehealth: Payer: Self-pay

## 2020-09-18 ENCOUNTER — Other Ambulatory Visit: Payer: Self-pay

## 2020-09-18 MED ORDER — NITROGLYCERIN 0.4 MG SL SUBL: 0.4000 mg | SUBLINGUAL_TABLET | SUBLINGUAL | 3 refills | Status: AC | PRN

## 2020-09-18 MED ORDER — NITROGLYCERIN 0.4 MG SL SUBL: 0.4000 mg | SUBLINGUAL_TABLET | SUBLINGUAL | 3 refills | Status: DC | PRN

## 2020-09-18 NOTE — Telephone Encounter (Signed)
Take SL NTG. If pain no better, consider going to the emergency room. If he would not want to go to the ED, I am happy to see him in the office. Any time is fine.  Thanks MJP

## 2020-09-18 NOTE — Telephone Encounter (Signed)
Pt will try NTG and if symptoms persist, he will make an appointment.

## 2020-09-18 NOTE — Telephone Encounter (Signed)
Patient states that he is experiencing discomfort on left side of chest. Pain is described as aching and cramping. No sharp pains, no pain when breathing. Patient would like advice on what to do.

## 2020-10-01 ENCOUNTER — Telehealth: Payer: Self-pay | Admitting: Internal Medicine

## 2020-10-01 ENCOUNTER — Encounter (INDEPENDENT_AMBULATORY_CARE_PROVIDER_SITE_OTHER): Payer: Medicare Other | Admitting: Ophthalmology

## 2020-10-01 NOTE — Telephone Encounter (Signed)
  Patient Consent for Virtual Visit         Shaun Brown has provided verbal consent on 10/01/2020 for a virtual visit (video or telephone).   CONSENT FOR VIRTUAL VISIT FOR:  Shaun Brown  By participating in this virtual visit I agree to the following:  I hereby voluntarily request, consent and authorize [facility] and its employed or contracted physicians, physician assistants, nurse practitioners or other licensed health care professionals (the Practitioner), to provide me with telemedicine health care services (the "Services") as deemed necessary by the treating Practitioner. I acknowledge and consent to receive the Services by the Practitioner via telemedicine. I understand that the telemedicine visit will involve communicating with the Practitioner through live audiovisual communication technology and the disclosure of certain medical information by electronic transmission. I acknowledge that I have been given the opportunity to request an in-person assessment or other available alternative prior to the telemedicine visit and am voluntarily participating in the telemedicine visit.  I understand that I have the right to withhold or withdraw my consent to the use of telemedicine in the course of my care at any time, without affecting my right to future care or treatment, and that the Practitioner or I may terminate the telemedicine visit at any time. I understand that I have the right to inspect all information obtained and/or recorded in the course of the telemedicine visit and may receive copies of available information for a reasonable fee.  I understand that some of the potential risks of receiving the Services via telemedicine include:  Marland Kitchen Delay or interruption in medical evaluation due to technological equipment failure or disruption; . Information transmitted may not be sufficient (e.g. poor resolution of images) to allow for appropriate medical decision making by the Practitioner;  and/or  . In rare instances, security protocols could fail, causing a breach of personal health information.  Furthermore, I acknowledge that it is my responsibility to provide information about my medical history, conditions and care that is complete and accurate to the best of my ability. I acknowledge that Practitioner's advice, recommendations, and/or decision may be based on factors not within their control, such as incomplete or inaccurate data provided by me or distortions of diagnostic images or specimens that may result from electronic transmissions. I understand that the practice of medicine is not an exact science and that Practitioner makes no warranties or guarantees regarding treatment outcomes. I acknowledge that a copy of this consent can be made available to me via my patient portal [facility] MyChart), or I can request a printed copy by calling the office of [facility].    I understand that my insurance will be billed for this visit.   I have read or had this consent read to me. . I understand the contents of this consent, which adequately explains the benefits and risks of the Services being provided via telemedicine.  . I have been provided ample opportunity to ask questions regarding this consent and the Services and have had my questions answered to my satisfaction. . I give my informed consent for the services to be provided through the use of telemedicine in my medical care

## 2020-10-02 ENCOUNTER — Telehealth: Payer: Medicare Other | Admitting: Internal Medicine

## 2020-10-02 ENCOUNTER — Other Ambulatory Visit: Payer: Self-pay

## 2020-10-02 DIAGNOSIS — I10 Essential (primary) hypertension: Secondary | ICD-10-CM | POA: Insufficient documentation

## 2020-10-02 DIAGNOSIS — I455 Other specified heart block: Secondary | ICD-10-CM | POA: Insufficient documentation

## 2020-10-02 NOTE — Progress Notes (Addendum)
Subjective:   Shaun Brown, male    DOB: [birth year], 87 y.o.   MRN: [id number]  Chief complaint:  S/p AVR   HPI  87 y/o Caucasian male with hyperlipidemia, bioprosthetic aortic valve replacement (Carpentier-Edwards Perimount prosthetic valve size #25) and aortic aneurysm repair in 2017 [facility], [place]), coronary artery disease s/p stent for MI in 2006, history of postop A. fib with no recurrence,h/o head and neck cancer s/p chemotherapy & radiation in 2000s, GERD, adenomatous polyp.  Patient made appt today due to elevated blood pressure.  Systolic blood pressure has ranged from 140s to 160s.  Patient and wife do endorse high salt diet, including anchovies with pasta sauce for dinner last night.  Is currently not on any antihypertensive agents.  He recently wore Zio patch to evaluate for any A. fib given his prior history. Report below. Specifically, he had up to 5.2 sec asymptomatic pauses while awake. He has not had any presyncope or syncope episodes.   Two weeks ago, he had an episode of chest tightness at rest, lasting for several minutes, resolved with SL NTG. He did have lightheadedness. He did not go to the ER then. Since then, he has shoveled snow and ice without any chest pain.  His virtual appt with Dr Eden Emms could not work yesterday due to technical issues.   Current Outpatient Medications on File Prior to Visit  Medication Sig Dispense Refill  . amLODipine (NORVASC) 5 MG tablet Take 1 tablet (5 mg total) by mouth daily. 30 tablet 3  . aspirin EC 81 MG tablet Take 81 mg by mouth daily.    Marland Kitchen atorvastatin (LIPITOR) 40 MG tablet Take 40 mg by mouth daily.    Marland Kitchen b complex vitamins tablet Take 1 tablet by mouth daily.    Marland Kitchen BESIVANCE 0.6 % SUSP Apply to eye.    . Calcium Carbonate (CALCIUM 600 PO) Take by mouth.    . Cholecalciferol 25 MCG (1000 UT) capsule Take 3,000 Units by mouth daily.    . mupirocin ointment (BACTROBAN) 2 % Apply 1 application topically daily.     . nitroGLYCERIN (NITROSTAT) 0.4 MG SL tablet Place 1 tablet (0.4 mg total) under the tongue every 5 (five) minutes as needed for chest pain. 25 tablet 3  . omeprazole (PRILOSEC) 20 MG capsule Take 20 mg by mouth daily.    . Prednisol Ace-Moxiflox-Bromfen 1-0.5-0.075 % SUSP Place 1 drop into the right eye 4 (four) times daily.    . sildenafil (VIAGRA) 50 MG tablet      No current facility-administered medications on file prior to visit.    Cardiovascular studies:  Mobile cardiac telemetry 12 days 08/22/2020 - 09/03/2020:  Dominant rhythm: Sinus.  HR 42-144 bpm. Avg HR 62 bpm, in sinus rhythm with IVCD.  3 pauses on 08/26/2020 within 15 secs, longest at 5.2 sec. No symptoms reported.  73 episodes of SVT/atrial tachycardia, fastest at 164 bpm for 4 beats, longest for 19 beats at 116 bpm.  1% isolated SVE, <1% couplet/triplets.  5 episodes of VT, fastest at 218 bpm for 8 beats, longest for 16 beats at 139 bpm.  3.3% isolated VE, 1.4% couplet, <1%triplets.  No atrial fibrillation/atrial flutter.  0 patient triggered events.     EKG 07/16/2020: Sinus rhythm 68 bpm Right bundle branch block and left anterior fascicular block Occasional ectopic ventricular beat      Echocardiogram 06/19/2020:  Left ventricle cavity is normal in size. Moderate concentric hypertrophy  of the left ventricle. Abnormal  septal wall motion due to post-operative  valve. Normal LV systolic function with EF 55%. Doppler evidence of grade  II (pseudonormal) diastolic dysfunction, elevated LAP.  S/p intact aneurysm repair.  Left atrial cavity is severely dilated.  Bioprosthetic aortic valve with normal functioning. Aortic valve mean  gradient of 14 mmHg, Vmax of 2.5 m/s. Calculated aortic valve area by  continuity equation is 1.3 cm. No aortic valve regurgitation noted.  Mildly restricted mitral valve leaflets without significant stenosis. Mild  (Grade I) mitral regurgitation.  Mild to moderate tricuspid  regurgitation. Peak RA-RV gradient 21 mmHg.  No significant change compared to previous study on 04/11/2019.  Carotid artery duplex 06/19/2020:  Minimal stenosis in the right internal carotid artery (minimal).  Stenosis in the left internal carotid artery (16-49%).  Right vertebral artery flow is not visualized. Left vertebral artery flow  is not visualized.  Study is limited by severe immobility of the neck.  Follow up in one year is appropriate if clinically indicated. No  significant change from 08/02/2019.  Nuclear stress test 2016: Normal   Recent labs: 05/19/2019: Glucose 101.  BUN/creatinine 16/1.1.  eGFR 63.  Sodium 140, potassium 4.1.  Rest of the CMP normal. H/H 13/42.  MCV 93.  Platelets 143.   Cholesterol 125, triglycerides 53, HDL 42, LDL 72. Apolipoprotein B 52, normal   Review of Systems  Cardiovascular: Negative for chest pain, dyspnea on exertion, leg swelling, palpitations and syncope.         Vitals:   10/03/20 0843  BP: (!) 149/82  Pulse: 60  Resp: 16  Temp: 98.8 F (37.1 C)  SpO2: 98%      Objective:    Physical Exam Vitals and nursing note reviewed.  Constitutional:      General: He is not in acute distress.    Appearance: He is well-developed.  Neck:     Vascular: No JVD.  Cardiovascular:     Heart sounds: Murmur heard.   Harsh early systolic murmur is present with a grade of 2/6 at the upper right sternal border radiating to the neck.   Pulmonary:     Effort: Pulmonary effort is normal.  Neurological:     Mental Status: He is alert and oriented to person, place, and time.           Assessment & Recommendations:   87 y/o Caucasian male with hyperlipidemia, bioprosthetic aortic valve replacement (Carpentier-Edwards Perimount prosthetic valve size #25) and aortic aneurysm repair in 2017 [facility], [place]), coronary artery disease s/p stent for MI in 2006, history of postop A. fib with no recurrence, asymptomatic sinus  pause (08/2020), h/o head and neck cancer s/p chemotherapy & radiation in 2000s, GERD, adenomatous polyp.  Sinus pause: Asymptomatic 5.2 sec pause during exercise, note don cardiac telemetry. EP consult pending.  With question of ischemia pending (see below), I suspect pacemaker placement may not be urgently warranted.   NSVT: Asymptomatic, up to 16 beat episodes. Unable to use BB due to sinus pauses. Will seek EP input.  Coronary artery disease involving native coronary artery of native heart without angina pectoris One nitrate responsive episode. No recurrence. Continue Aspirin, statin, amlodipine, SL NTG prn. Avoid sildenafil.  Will obtain exercise/nuclear stress testing.  Hypertension:  Recommended low-salt diet.  Started amlodipine 5 mg daily.  Encourage keeping blood pressure log at home.  If blood pressure remains elevated, could add lisinopril or hydrochlorothiazide.  S/P AVR (aortic valve replacement) and aortoplasty Clinically stable. Normally functioning of bioprosthetic aortic  valve.  Aortic root dilatation: Status post ascending aorta aneurysm repair.  I will obtain a surveillance STA.  H/O post op Afib: In 2017.  No recurrence of A. fib.     F/u after stress test  Nigel Mormon, MD [facility]. [place] Pager: [phone] Office: [phone] If no answer Cell [phone]

## 2020-10-03 ENCOUNTER — Encounter: Payer: Self-pay | Admitting: Internal Medicine

## 2020-10-03 ENCOUNTER — Ambulatory Visit (INDEPENDENT_AMBULATORY_CARE_PROVIDER_SITE_OTHER): Payer: Medicare Other | Admitting: Internal Medicine

## 2020-10-03 ENCOUNTER — Other Ambulatory Visit: Payer: Self-pay

## 2020-10-03 ENCOUNTER — Encounter: Payer: Self-pay | Admitting: Cardiology

## 2020-10-03 ENCOUNTER — Ambulatory Visit: Payer: Medicare Other | Admitting: Cardiology

## 2020-10-03 VITALS — BP 149/82 | HR 60 | Temp 98.8°F | Resp 16 | Ht 69.0 in | Wt 189.0 lb

## 2020-10-03 VITALS — BP 110/74 | HR 66 | Ht 69.0 in | Wt 186.8 lb

## 2020-10-03 DIAGNOSIS — I712 Thoracic aortic aneurysm, without rupture, unspecified: Secondary | ICD-10-CM

## 2020-10-03 DIAGNOSIS — I455 Other specified heart block: Secondary | ICD-10-CM

## 2020-10-03 DIAGNOSIS — I25118 Atherosclerotic heart disease of native coronary artery with other forms of angina pectoris: Secondary | ICD-10-CM

## 2020-10-03 DIAGNOSIS — I10 Essential (primary) hypertension: Secondary | ICD-10-CM

## 2020-10-03 DIAGNOSIS — I2510 Atherosclerotic heart disease of native coronary artery without angina pectoris: Secondary | ICD-10-CM

## 2020-10-03 DIAGNOSIS — Z952 Presence of prosthetic heart valve: Secondary | ICD-10-CM

## 2020-10-03 DIAGNOSIS — Z8679 Personal history of other diseases of the circulatory system: Secondary | ICD-10-CM

## 2020-10-03 NOTE — Patient Instructions (Signed)
Medication Instructions:  Your physician recommends that you continue on your current medications as directed. Please refer to the Current Medication list given to you today.  *If you need a refill on your cardiac medications before your next appointment, please call your pharmacy*   Lab Work: None ordered If you have labs (blood work) drawn today and your tests are completely normal, you will receive your results only by: Marland Kitchen MyChart Message (if you have MyChart) OR . A paper copy in the mail If you have any lab test that is abnormal or we need to change your treatment, we will call you to review the results.   Testing/Procedures: None ordered   Follow-Up: At [facility], you and your health needs are our priority.  As part of our continuing mission to provide you with exceptional heart care, we have created designated Provider Care Teams.  These Care Teams include your primary Cardiologist (physician) and Advanced Practice Providers (APPs -  Physician Assistants and Nurse Practitioners) who all work together to provide you with the care you need, when you need it.  Your next appointment:    to be determined  (the office will call to arrange in-office loop recorder implant)  The format for your next appointment:   In Person  Provider:   Sherryl Manges, MD    Thank you for choosing [facility]!!    Other Instructions   Implantable Loop Recorder Placement  An implantable loop recorder is a small electronic device that is placed under the skin of your chest. The device records the electrical activity of your heart over a long period of time. Your health care provider can download these recordings to monitor your heart. You may need an implantable loop recorder if you have periods of abnormal heart activity (arrhythmias) or unexplained fainting (syncope). The recorder can be left in place for 1 year or longer. Tell a health care provider about:  Any allergies you  have.  All medicines you are taking, including vitamins, herbs, eye drops, creams, and over-the-counter medicines.  Any problems you or family members have had with anesthetic medicines.  Any blood disorders you have.  Any surgeries you have had.  Any medical conditions you have.  Whether you are pregnant or may be pregnant. What are the risks? Generally, this is a safe procedure. However, problems may occur, including:  Infection.  Bleeding.  Allergic reactions to anesthetic medicines.  Damage to nerves or blood vessels.  Failure of the device to work. This could require another surgery to replace it. What happens before the procedure?  You may have a physical exam, blood tests, and imaging tests of your heart, such as a chest X-ray.  Follow instructions from your health care provider about eating or drinking restrictions.  Ask your health care provider about: ? Changing or stopping your regular medicines. This is especially important if you are taking diabetes medicines or blood thinners. ? Taking medicines such as aspirin and ibuprofen. These medicines can thin your blood. Do not take these medicines unless your health care provider tells you to take them. ? Taking over-the-counter medicines, vitamins, herbs, and supplements.  Ask your health care provider how your surgical site will be marked or identified.  Ask your health care provider what steps will be taken to help prevent infection. These may include: ? Removing hair at the surgery site. ? Washing skin with a germ-killing soap.  Plan to have someone take you home from the hospital or clinic.  Plan to have a responsible adult care for you for at least 24 hours after you leave the hospital or clinic. This is important.  Do not use any products that contain nicotine or tobacco, such as cigarettes and e-cigarettes. If you need help quitting, ask your health care provider.   What happens during the procedure?  An  IV will be inserted into one of your veins.  You may be given one or more of the following: ? A medicine to help you relax (sedative). ? A medicine to numb the area (local anesthetic).  A small incision will be made on the left side of your upper chest.  A pocket will be created under your skin.  The device will be placed in the pocket.  The incision will be closed with stitches (sutures) or adhesive strips.  A bandage (dressing) will be placed over the incision. The procedure may vary among health care providers and hospitals. What happens after the procedure?  Your blood pressure, heart rate, breathing rate, and blood oxygen level will be monitored until you leave the hospital or clinic.  You may be able to go home on the day of your surgery. Before you go home: ? Your health care provider will program your recorder. ? You will learn how to trigger your device with a handheld activator. ? You will learn how to send recordings to your health care provider. ? You will get an ID card for your device, and you will be told when to use it.  Do not drive for 24 hours if you were given a sedative during your procedure. Summary  An implantable loop recorder is a small electronic device that is placed under the skin of your chest to monitor your heart over a long period of time.  The recorder can be left in place for 1 year or longer.  Plan to have someone take you home from the hospital or clinic. This information is not intended to replace advice given to you by your health care provider. Make sure you discuss any questions you have with your health care provider. Document Revised: 05/22/2020 Document Reviewed: 10/17/2017 Elsevier Patient Education  2021 [employer].

## 2020-10-03 NOTE — Progress Notes (Signed)
ELECTROPHYSIOLOGY CONSULT NOTE  Patient ID: Shaun Brown, MRN: [id number], DOB/AGE: [birth year] 87 y.o. Admit date: (Not on file) Date of Consult: 10/03/2020  Primary Physician: Gaspar Garbe, MD Primary Cardiologist: MPat     Shaun Brown is a 87 y.o. male who is being seen today for the evaluation of pauses at the request of DR MO.    HPI Shaun Brown is a 87 y.o. male referred for abnormal monitor   This was undertaken because of his hx of  post op Afib, without recurrences. Hx of AVR- bioprosthesis and aortic aneurysm repair [facility]), CAD with prior MI and stenting, 2006    No palpitations and no syncope or presyncope  Some sleep disturbed breathing  Recent intercurrent chest pain which was not exertional >> myoview pending   Event Recorder personnally reviewed  >> pause > 5 sec 3 events all within 3 minutes, Also VT NS and NONE  triggered   DATE TEST EF   *10/21 Echo  55 % LAE severe        Date Cr K Hgb  3/21 1.2 4.7 13.3            Past Medical History:  Diagnosis Date  . CAD (coronary artery disease)   . Hyperlipidemia   . MI (myocardial infarction) [facility])       Surgical History:  Past Surgical History:  Procedure Laterality Date  . CORONARY ANGIOPLASTY  2006  . CORONARY ARTERY BYPASS GRAFT    . HERNIA REPAIR    . VALVE REPLACEMENT       Home Meds: Current Meds  Medication Sig  . amLODipine (NORVASC) 5 MG tablet Take 1 tablet (5 mg total) by mouth daily.  Marland Kitchen aspirin EC 81 MG tablet Take 81 mg by mouth daily.  Marland Kitchen atorvastatin (LIPITOR) 40 MG tablet Take 40 mg by mouth daily.  Marland Kitchen b complex vitamins tablet Take 1 tablet by mouth daily.  Marland Kitchen BESIVANCE 0.6 % SUSP Apply to eye.  . Calcium Carbonate (CALCIUM 600 PO) Take by mouth.  . Cholecalciferol 25 MCG (1000 UT) capsule Take 3,000 Units by mouth daily.  . mupirocin ointment (BACTROBAN) 2 % Apply 1 application topically daily.  . nitroGLYCERIN (NITROSTAT) 0.4 MG SL tablet  Place 1 tablet (0.4 mg total) under the tongue every 5 (five) minutes as needed for chest pain.  Marland Kitchen omeprazole (PRILOSEC) 20 MG capsule Take 20 mg by mouth daily.  . Prednisol Ace-Moxiflox-Bromfen 1-0.5-0.075 % SUSP Place 1 drop into the right eye 4 (four) times daily.    Allergies:  Allergies  Allergen Reactions  . Tetanus Toxoids   . Amoxicillin Rash    Social History   Socioeconomic History  . Marital status: Married    Spouse name: Not on file  . Number of children: 5  . Years of education: Not on file  . Highest education level: Not on file  Occupational History  . Not on file  Tobacco Use  . Smoking status: Former Smoker    Years: 20.00    Types: Pipe    Quit date: 01/04/2001    Years since quitting: 19.7  . Smokeless tobacco: Former Neurosurgeon    Types: Chew    Quit date: 01/04/2001  Substance and Sexual Activity  . Alcohol use: Yes    Comment: occ  . Drug use: Never  . Sexual activity: Not on file  Other Topics Concern  . Not on file  Social History Narrative  .  Not on file   Social Determinants of Health   Financial Resource Strain: Not on file  Food Insecurity: Not on file  Transportation Needs: Not on file  Physical Activity: Not on file  Stress: Not on file  Social Connections: Not on file  Intimate Partner Violence: Not on file     Family History  Problem Relation Age of Onset  . Diabetes Mother   . Arthritis Mother   . Leukemia Father   . Hypertension Father   . Non-Hodgkin's lymphoma Sister   . Prostate cancer Brother      ROS:  Please see the history of present illness.     All other systems reviewed and negative.    Physical Exam:  Blood pressure 110/74, pulse 66, height 5\' 9"  (1.753 m), weight 186 lb 12.8 oz (84.7 kg), SpO2 96 %. General: Well developed, well nourished male in no acute distress. Head: Normocephalic, atraumatic, sclera non-icteric, no xanthomas, nares are without discharge. EENT: normal  Lymph Nodes:  none Neck: Negative  for carotid bruits. JVD not elevated. Back:without scoliosis kyphosis  Lungs: Clear bilaterally to auscultation without wheezes, rales, or rhonchi. Breathing is unlabored. Heart: RRR with S1 S2. No  murmur . No rubs, or gallops appreciated. Abdomen: Soft, non-tender, non-distended with normoactive bowel sounds. No hepatomegaly. No rebound/guarding. No obvious abdominal masses. Msk:  Strength and tone appear normal for age. Extremities: No clubbing or cyanosis. No  edema.  Distal pedal pulses are 2+ and equal bilaterally. Skin: Warm and Dry Neuro: Alert and oriented X 3. CN III-XII intact Grossly normal sensory and motor function . Psych:  Responds to questions appropriately with a normal affect.      Labs: Cardiac Enzymes No results for input(s): CKTOTAL, CKMB, TROPONINI in the last 72 hours. CBC No results found for: WBC, HGB, HCT, MCV, PLT PROTIME: No results for input(s): LABPROT, INR in the last 72 hours. Chemistry No results for input(s): NA, K, CL, CO2, BUN, CREATININE, CALCIUM, PROT, BILITOT, ALKPHOS, ALT, AST, GLUCOSE in the last 168 hours.  Invalid input(s): LABALBU Lipids No results found for: CHOL, HDL, LDLCALC, TRIG BNP No results found for: PROBNP Thyroid Function Tests: No results for input(s): TSH, T4TOTAL, T3FREE, THYROIDAB in the last 72 hours.  Invalid input(s): FREET3 Miscellaneous No results found for: DDIMER  Radiology/Studies:  LONG TERM MONITOR (3-14 DAYS)  Result Date: 09/19/2020 Mobile cardiac telemetry 12 days 08/22/2020 - 09/03/2020: Dominant rhythm: Sinus. HR 42-144 bpm. Avg HR 62 bpm, in sinus rhythm with IVCD. 3 pauses on 08/26/2020 within 15 secs, longest at 5.2 sec. No symptoms reported. 73 episodes of SVT/atrial tachycardia, fastest at 164 bpm for 4 beats, longest for 19 beats at 116 bpm. 1% isolated SVE, <1% couplet/triplets. 5 episodes of VT, fastest at 218 bpm for 8 beats, longest for 16 beats at 139 bpm. 3.3% isolated VE, 1.4% couplet,  <1%triplets. No atrial fibrillation/atrial flutter. 0 patient triggered events.    EKG: sinus @ 67 w occ PACV Intervals11/12/42  Event recorder personally reviewed  1) VT-NS 2) sinus pauses and in an isolated time 350 08/26/20 there was sinus slowing with pauses of > 5 secs   Assessment and Plan:   Sinus pauses  VT-NS  CAD with prior MI  Aortic Valve replacement and Ascending aortic aneurysm  Ventricular tachycardia  Sinus pauses of this duration I would have expected to be symptomatic.  He might have been sitting and his wife says he often nods off while he is reading n  the afternoon.  Hence there may be a trigger for vagal slowing ( although the slowing was not gradual)  We discussed the role of pacemaker but not clear that is indicated n the absence of symptoms and then discussed the role of LINQ to look for evidence of recurrent bradycardia which might justify pacing  He is agreeable  Also have asked regarding the need for familial screening given hx of aortic aneurysm   Reviewed with Dr Karl Pock and will recommend consultation  Given near normal LV function, the prognostic implication of VTNS is minimal  Given hx of bradycardia would not use BB  Await input from Dr Linton Ham      Sherryl Manges

## 2020-10-04 ENCOUNTER — Encounter (INDEPENDENT_AMBULATORY_CARE_PROVIDER_SITE_OTHER): Payer: Medicare Other | Admitting: Ophthalmology

## 2020-10-04 DIAGNOSIS — H35033 Hypertensive retinopathy, bilateral: Secondary | ICD-10-CM | POA: Diagnosis not present

## 2020-10-04 DIAGNOSIS — H43813 Vitreous degeneration, bilateral: Secondary | ICD-10-CM | POA: Diagnosis not present

## 2020-10-04 DIAGNOSIS — I10 Essential (primary) hypertension: Secondary | ICD-10-CM | POA: Diagnosis not present

## 2020-10-04 DIAGNOSIS — H34832 Tributary (branch) retinal vein occlusion, left eye, with macular edema: Secondary | ICD-10-CM | POA: Diagnosis not present

## 2020-10-09 LAB — BASIC METABOLIC PANEL
BUN/Creatinine Ratio: 13 (ref 10–24)
BUN: 17 mg/dL (ref 8–27)
CO2: 25 mmol/L (ref 20–29)
Calcium: 9.1 mg/dL (ref 8.6–10.2)
Chloride: 102 mmol/L (ref 96–106)
Creatinine, Ser: 1.29 mg/dL — ABNORMAL HIGH (ref 0.76–1.27)
GFR calc Af Amer: 58 mL/min/{1.73_m2} — ABNORMAL LOW (ref >59)
GFR calc non Af Amer: 50 mL/min/{1.73_m2} — ABNORMAL LOW (ref >59)
Glucose: 89 mg/dL (ref 65–99)
Potassium: 4.5 mmol/L (ref 3.5–5.2)
Sodium: 140 mmol/L (ref 134–144)

## 2020-10-09 LAB — CBC
Hematocrit: 40.6 % (ref 37.5–51.0)
Hemoglobin: 13.6 g/dL (ref 13.0–17.7)
MCH: 29.4 pg (ref 26.6–33.0)
MCHC: 33.5 g/dL (ref 31.5–35.7)
MCV: 88 fL (ref 79–97)
Platelets: 135 10*3/uL — ABNORMAL LOW (ref 150–450)
RBC: 4.62 x10E6/uL (ref 4.14–5.80)
RDW: 13.1 % (ref 11.6–15.4)
WBC: 3.9 10*3/uL (ref 3.4–10.8)

## 2020-10-09 LAB — LIPID PANEL
Chol/HDL Ratio: 2.4 ratio (ref 0.0–5.0)
Cholesterol, Total: 133 mg/dL (ref 100–199)
HDL: 56 mg/dL (ref >39)
LDL Chol Calc (NIH): 65 mg/dL (ref 0–99)
Triglycerides: 57 mg/dL (ref 0–149)
VLDL Cholesterol Cal: 12 mg/dL (ref 5–40)

## 2020-10-22 ENCOUNTER — Other Ambulatory Visit: Payer: Self-pay

## 2020-10-22 ENCOUNTER — Ambulatory Visit: Payer: Medicare Other

## 2020-10-22 DIAGNOSIS — I25118 Atherosclerotic heart disease of native coronary artery with other forms of angina pectoris: Secondary | ICD-10-CM

## 2020-11-01 ENCOUNTER — Other Ambulatory Visit: Payer: Self-pay

## 2020-11-01 ENCOUNTER — Encounter (INDEPENDENT_AMBULATORY_CARE_PROVIDER_SITE_OTHER): Payer: Medicare Other | Admitting: Ophthalmology

## 2020-11-01 DIAGNOSIS — H35033 Hypertensive retinopathy, bilateral: Secondary | ICD-10-CM | POA: Diagnosis not present

## 2020-11-01 DIAGNOSIS — I10 Essential (primary) hypertension: Secondary | ICD-10-CM

## 2020-11-01 DIAGNOSIS — H43813 Vitreous degeneration, bilateral: Secondary | ICD-10-CM | POA: Diagnosis not present

## 2020-11-01 DIAGNOSIS — H34832 Tributary (branch) retinal vein occlusion, left eye, with macular edema: Secondary | ICD-10-CM | POA: Diagnosis not present

## 2020-11-25 ENCOUNTER — Other Ambulatory Visit: Payer: Self-pay | Admitting: Cardiology

## 2020-11-25 DIAGNOSIS — I10 Essential (primary) hypertension: Secondary | ICD-10-CM

## 2020-11-28 ENCOUNTER — Telehealth: Payer: Self-pay

## 2020-11-28 NOTE — Telephone Encounter (Signed)
Patient called wanting to know if Dr. Graciela Husbands still had intentions if inserting a loop, please advise. Thanks Delice Bison

## 2020-11-28 NOTE — Addendum Note (Signed)
Addended by: Elder Negus on: 11/28/2020 11:50 AM   Modules accepted: Orders

## 2020-11-29 ENCOUNTER — Encounter (INDEPENDENT_AMBULATORY_CARE_PROVIDER_SITE_OTHER): Payer: Medicare Other | Admitting: Ophthalmology

## 2020-11-29 NOTE — Telephone Encounter (Signed)
Appointment has been scheduled for 12/10/2020 for ILR implant.  Pt is aware.

## 2020-11-30 ENCOUNTER — Other Ambulatory Visit: Payer: Self-pay

## 2020-11-30 ENCOUNTER — Encounter (INDEPENDENT_AMBULATORY_CARE_PROVIDER_SITE_OTHER): Payer: Medicare Other | Admitting: Ophthalmology

## 2020-11-30 DIAGNOSIS — H43813 Vitreous degeneration, bilateral: Secondary | ICD-10-CM | POA: Diagnosis not present

## 2020-11-30 DIAGNOSIS — H35033 Hypertensive retinopathy, bilateral: Secondary | ICD-10-CM

## 2020-11-30 DIAGNOSIS — H34832 Tributary (branch) retinal vein occlusion, left eye, with macular edema: Secondary | ICD-10-CM

## 2020-11-30 DIAGNOSIS — I10 Essential (primary) hypertension: Secondary | ICD-10-CM

## 2020-12-10 ENCOUNTER — Ambulatory Visit (INDEPENDENT_AMBULATORY_CARE_PROVIDER_SITE_OTHER): Payer: Medicare Other | Admitting: Internal Medicine

## 2020-12-10 ENCOUNTER — Encounter: Payer: Self-pay | Admitting: Internal Medicine

## 2020-12-10 ENCOUNTER — Other Ambulatory Visit: Payer: Self-pay

## 2020-12-10 VITALS — BP 100/82 | HR 55 | Ht 69.0 in | Wt 187.0 lb

## 2020-12-10 DIAGNOSIS — I455 Other specified heart block: Secondary | ICD-10-CM | POA: Diagnosis not present

## 2020-12-10 DIAGNOSIS — I25118 Atherosclerotic heart disease of native coronary artery with other forms of angina pectoris: Secondary | ICD-10-CM

## 2020-12-10 DIAGNOSIS — I480 Paroxysmal atrial fibrillation: Secondary | ICD-10-CM | POA: Diagnosis not present

## 2020-12-10 NOTE — Patient Instructions (Addendum)
Medication Instructions:  Your physician recommends that you continue on your current medications as directed. Please refer to the Current Medication list given to you today.  *If you need a refill on your cardiac medications before your next appointment, please call your pharmacy*   Lab Work: None ordered.  If you have labs (blood work) drawn today and your tests are completely normal, you will receive your results only by: Marland Kitchen MyChart Message (if you have MyChart) OR . A paper copy in the mail If you have any lab test that is abnormal or we need to change your treatment, we will call you to review the results.   Testing/Procedures: None ordered.    Follow-Up: At [facility], you and your health needs are our priority.  As part of our continuing mission to provide you with exceptional heart care, we have created designated Provider Care Teams.  These Care Teams include your primary Cardiologist (physician) and Advanced Practice Providers (APPs -  Physician Assistants and Nurse Practitioners) who all work together to provide you with the care you need, when you need it.  We recommend signing up for the patient portal called "MyChart".  Sign up information is provided on this After Visit Summary.  MyChart is used to connect with patients for Virtual Visits (Telemedicine).  Patients are able to view lab/test results, encounter notes, upcoming appointments, etc.  Non-urgent messages can be sent to your provider as well.   To learn more about what you can do with MyChart, go to [email or web address].    Your next appointment:   Follow up as needed with Dr Graciela Husbands   Other Instructions You may shower tomorrow with the Tegaderm on.   You may remove the larger outer bandage on Friday or Saturday Wound Care, Adult Taking care of your wound properly can help to prevent pain, infection, and scarring. It can also help your wound heal more quickly. Follow instructions from your health care  provider about how to care for your wound. Supplies needed:  Soap and water.  Wound cleanser.  Gauze.  If needed, a clean bandage (dressing) or other type of wound dressing material to cover or place in the wound. Follow your health care provider's instructions about what dressing supplies to use.   How to care for your wound Cleaning the wound Ask your health care provider how to clean the wound. This may include:  Using mild soap and water or a wound cleanser.  Using a clean gauze to pat the wound dry after cleaning it. Do not rub or scrub the wound. Dressing care  Wash your hands with soap and water for at least 20 seconds before and after you change the dressing. If soap and water are not available, use hand sanitizer.  Change your dressing as told by your health care provider. This may include: ? Cleaning or rinsing out (irrigating) the wound. ? Placing a dressing over the wound or in the wound (packing). ? Covering the wound with an outer dressing.  Leave any adhesive strips in place. These skin closures may need to stay in place for 2 weeks or longer. If adhesive strip edges start to loosen and curl up, you may trim the loose edges. Do not remove adhesive strips completely unless your health care provider tells you to do that.  Ask your health care provider when you can leave the wound uncovered. Checking for infection Check your wound area every day for signs of infection. Check for:  More redness, swelling,  or pain.  Fluid or blood.  Warmth.  Pus or a bad smell.     Take over-the-counter and prescription medicines only as told by your health care provider. Eating and drinking  Eat a diet that includes protein, vitamin A, vitamin C, and other nutrient-rich foods to help the wound heal. ? Foods rich in protein include meat, fish, eggs, dairy, beans, and nuts. ? Foods rich in vitamin A include carrots and dark green, leafy vegetables. ? Foods rich in vitamin C  include citrus fruits, tomatoes, broccoli, and peppers.  Drink enough fluid to keep your urine pale yellow. General instructions  Do not take baths, swim, use a hot tub, or do anything that would put the wound underwater until your health care provider approves. Ask your health care provider if you may take showers. You may only be allowed to take sponge baths.  Do not scratch or pick at the wound. Keep it covered as told by your health care provider.  Return to your normal activities as told by your health care provider. Ask your health care provider what activities are safe for you.  Protect your wound from the sun when you are outside for the first 6 months, or for as long as told by your health care provider. Cover up the scar area or apply sunscreen that has an SPF of at least 30.  Do not use any products that contain nicotine or tobacco, such as cigarettes, e-cigarettes, and chewing tobacco. These may delay wound healing. If you need help quitting, ask your health care provider.  Keep all follow-up visits as told by your health care provider. This is important. Contact a health care provider if:  You received a tetanus shot and you have swelling, severe pain, redness, or bleeding at the injection site.  Your pain is not controlled with medicine.  You have any of these signs of infection: ? More redness, swelling, or pain around the wound. ? Fluid or blood coming from the wound. ? Warmth coming from the wound. ? Pus or a bad smell coming from the wound. ? A fever or chills.  You are nauseous or you vomit.  You are dizzy. Get help right away if:  You have a red streak of skin near the area around your wound.  Your wound has been closed with staples, sutures, skin glue, or adhesive strips and it begins to open up and separate.  Your wound is bleeding, and the bleeding does not stop with gentle pressure.  You have a rash.  You faint.  You have trouble breathing. These  symptoms may represent a serious problem that is an emergency. Do not wait to see if the symptoms will go away. Get medical help right away. Call your local emergency services (911 in the [place] Do not drive yourself to the hospital. Summary  Always wash your hands with soap and water for at least 20 seconds before and after changing your dressing.  Change your dressing as told by your health care provider.  To help with healing, eat foods that are rich in protein, vitamin A, vitamin C, and other nutrients.  Check your wound every day for signs of infection. Contact your health care provider if you suspect that your wound is infected. This information is not intended to replace advice given to you by your health care provider. Make sure you discuss any questions you have with your health care provider. Document Revised: 06/17/2019 Document Reviewed: 06/17/2019 Elsevier Patient Education  [employer].

## 2020-12-10 NOTE — Progress Notes (Signed)
Patient Care Team: Tisovec, Adelfa Koh, MD as PCP - General (Internal Medicine)   HPI  Shaun Brown is a 87 y.o. male seen today in followup for an abnormal  Event Recorder personnally reviewed  >> pause > 5 secs 3 events all within 3 minutes, Also VT NS and NONE  triggered  Hx of CAD with prior MI   For loop recorder  The patient denies chest pain, shortness of breath, nocturnal dyspnea, orthopnea or peripheral edema.  There have been no palpitations, lightheadedness or syncope.     DATE TEST EF   *10/21 Echo  55 % LAE severe  2/22 MYOVIEW 45% Mild LV dysfuncion 'Inf HK   Date Cr K Hgb  3/21 1.2 4.7 13.3          Records and Results Reviewed   Past Medical History:  Diagnosis Date  . CAD (coronary artery disease)   . Hyperlipidemia   . MI (myocardial infarction) [facility])     Past Surgical History:  Procedure Laterality Date  . CORONARY ANGIOPLASTY  2006  . CORONARY ARTERY BYPASS GRAFT    . HERNIA REPAIR    . VALVE REPLACEMENT      Current Meds  Medication Sig  . amLODipine (NORVASC) 5 MG tablet TAKE 1 TABLET (5 MG TOTAL) BY MOUTH DAILY.  Marland Kitchen aspirin EC 81 MG tablet Take 81 mg by mouth daily.  Marland Kitchen atorvastatin (LIPITOR) 40 MG tablet Take 40 mg by mouth daily.  Marland Kitchen b complex vitamins tablet Take 1 tablet by mouth daily.  Marland Kitchen BESIVANCE 0.6 % SUSP Apply to eye.  . Calcium Carbonate (CALCIUM 600 PO) Take by mouth.  . Cholecalciferol 25 MCG (1000 UT) capsule Take 3,000 Units by mouth daily.  . mupirocin ointment (BACTROBAN) 2 % Apply 1 application topically daily.  . nitroGLYCERIN (NITROSTAT) 0.4 MG SL tablet Place 1 tablet (0.4 mg total) under the tongue every 5 (five) minutes as needed for chest pain.  Marland Kitchen omeprazole (PRILOSEC) 20 MG capsule Take 20 mg by mouth daily.  . Prednisol Ace-Moxiflox-Bromfen 1-0.5-0.075 % SUSP Place 1 drop into the right eye 4 (four) times daily.    Allergies  Allergen Reactions  . Tetanus Toxoids   . Amoxicillin Rash       Review of Systems negative except from HPI and PMH  Physical Exam BP 100/82   Pulse (!) 55   Ht 5\' 9"  (1.753 m)   Wt 187 lb (84.8 kg)   SpO2 98%   BMI 27.62 kg/m  Well developed and nourished in no acute distress HENT normal Neck supple  Clear Regular rate and rhythm, no murmurs or gallops Abd-soft with active BS No Clubbing cyanosis edema Skin-warm and dry A & Oriented  Grossly normal sensory and motor function   ECG sinus @ 55 18/13/44  CrCl cannot be calculated (Patient's most recent lab result is older than the maximum 21 days allowed.).   Assessment and  Plan Sinus pauses  VT-NS  CAD with prior MI  Aortic Valve replacement and Ascending aortic aneurysm  Ventricular tachycardia  Without symptoms of ischemia  No syncope/LH  For event recorder   Pre op Dx  Sinus pauses > 5 sec but without pauses, LH Post op Dx Same  Procedure  Loop Recorder implantation  After routine prep and drape of the left parasternal area, a small incision was created. A Medtronic LINQ Reveal Loop Recorder  Serial Number  [id number] was inserted.    SteriStrip dressing  was  applied.  The patient tolerated the procedure without apparent complication.  EBL < 10cc     Current medicines are reviewed at length with the patient today .  The patient does not have concerns regarding medicines.

## 2020-12-14 ENCOUNTER — Ambulatory Visit
Admission: RE | Admit: 2020-12-14 | Discharge: 2020-12-14 | Disposition: A | Discharge status: Home / Self Care | Payer: Medicare Other | Source: Ambulatory Visit | Attending: Cardiology | Admitting: Cardiology

## 2020-12-14 ENCOUNTER — Other Ambulatory Visit: Payer: Self-pay

## 2020-12-14 DIAGNOSIS — I712 Thoracic aortic aneurysm, without rupture, unspecified: Secondary | ICD-10-CM

## 2020-12-14 MED ORDER — IOPAMIDOL (ISOVUE-370) INJECTION 76%
75.0000 mL | Freq: Once | INTRAVENOUS | Status: AC | PRN
  Administered 2020-12-14: 75 mL via INTRAVENOUS

## 2020-12-21 ENCOUNTER — Encounter: Payer: Self-pay | Admitting: Cardiology

## 2020-12-21 ENCOUNTER — Other Ambulatory Visit: Payer: Self-pay

## 2020-12-21 ENCOUNTER — Ambulatory Visit: Payer: Medicare Other | Admitting: Cardiology

## 2020-12-21 VITALS — BP 137/83 | HR 70 | Temp 98.3°F | Resp 16 | Ht 69.0 in | Wt 190.0 lb

## 2020-12-21 DIAGNOSIS — K769 Liver disease, unspecified: Secondary | ICD-10-CM

## 2020-12-21 DIAGNOSIS — I712 Thoracic aortic aneurysm, without rupture, unspecified: Secondary | ICD-10-CM

## 2020-12-21 DIAGNOSIS — I2510 Atherosclerotic heart disease of native coronary artery without angina pectoris: Secondary | ICD-10-CM

## 2020-12-21 DIAGNOSIS — Z952 Presence of prosthetic heart valve: Secondary | ICD-10-CM

## 2020-12-21 DIAGNOSIS — I10 Essential (primary) hypertension: Secondary | ICD-10-CM

## 2020-12-21 DIAGNOSIS — I480 Paroxysmal atrial fibrillation: Secondary | ICD-10-CM

## 2020-12-21 NOTE — Progress Notes (Signed)
Subjective:   Shaun Brown, male    DOB: [birth year], 87 y.o.   MRN: [id number]  Chief complaint:  S/p AVR   HPI  87 y/o Caucasian male with hyperlipidemia, bioprosthetic aortic valve replacement (Carpentier-Edwards Perimount prosthetic valve size #25) and aortic aneurysm repair in 2017 [facility], [place]), coronary artery disease s/p stent for MI in 2006, history of postop A. fib with no recurrence,h/o head and neck cancer s/p chemotherapy & radiation in 2000s, GERD, adenomatous polyp.  Patient is doing well, and has not had any recent chest pain, shortness of breath, clinical symptoms.  He does noticed that that if he sings longer than 15 minutes, he is to catch his breath, but does not have any exertional dyspnea.  He recently underwent loop recorder placement by Dr. Caryl Comes, no significant abnormalities found yet.  Reviewed recent CT scan results with patient, details below.  Current Outpatient Medications on File Prior to Visit  Medication Sig Dispense Refill  . amLODipine (NORVASC) 5 MG tablet TAKE 1 TABLET (5 MG TOTAL) BY MOUTH DAILY. 90 tablet 1  . aspirin EC 81 MG tablet Take 81 mg by mouth daily.    Marland Kitchen atorvastatin (LIPITOR) 40 MG tablet Take 40 mg by mouth daily.    Marland Kitchen b complex vitamins tablet Take 1 tablet by mouth daily.    Marland Kitchen BESIVANCE 0.6 % SUSP Apply to eye.    . Calcium Carbonate (CALCIUM 600 PO) Take by mouth.    . Cholecalciferol 25 MCG (1000 UT) capsule Take 3,000 Units by mouth daily.    . mupirocin ointment (BACTROBAN) 2 % Apply 1 application topically daily.    . nitroGLYCERIN (NITROSTAT) 0.4 MG SL tablet Place 1 tablet (0.4 mg total) under the tongue every 5 (five) minutes as needed for chest pain. 25 tablet 3  . omeprazole (PRILOSEC) 20 MG capsule Take 20 mg by mouth daily.    . Prednisol Ace-Moxiflox-Bromfen 1-0.5-0.075 % SUSP Place 1 drop into the right eye 4 (four) times daily.     No current facility-administered medications on file prior to  visit.    Cardiovascular studies:  EKG 12/10/2020: Sinus rhythm 55 bpm LAFB  CTA Chest 12/14/2020: 4.9 cm ascending thoracic aortic aneurysm. Upper Abdomen: Imaged portion of the liver demonstrates multiple right hepatic lesions with peripheral arterial enhancement and a few scattered small hypodense lesions suspicious for cysts. Liver is incompletely imaged to fully characterize. These may represent scattered hemangiomas. Recommend further evaluation with nonemergent MRI without and with contrast.  Lexiscan (Walking with mod Bruce)Tetrofosmin Stress Test  10/22/2020: Nondiagnostic ECG stress. There is a very small sized reversible defect in the inferior region.  Overall LV systolic function is abnormal with hypokinesis of the mid inferior wall and basal inferior wall.  Stress LV EF: 45%.  No previous exam available for comparison. Low risk.    Mobile cardiac telemetry 12 days 08/22/2020 - 09/03/2020:  Dominant rhythm: Sinus.  HR 42-144 bpm. Avg HR 62 bpm, in sinus rhythm with IVCD.  3 pauses on 08/26/2020 within 15 secs, longest at 5.2 sec. No symptoms reported.  73 episodes of SVT/atrial tachycardia, fastest at 164 bpm for 4 beats, longest for 19 beats at 116 bpm.  1% isolated SVE, <1% couplet/triplets.  5 episodes of VT, fastest at 218 bpm for 8 beats, longest for 16 beats at 139 bpm.  3.3% isolated VE, 1.4% couplet, <1%triplets.  No atrial fibrillation/atrial flutter.  0 patient triggered events.    Echocardiogram 06/19/2020:  Left ventricle  cavity is normal in size. Moderate concentric hypertrophy  of the left ventricle. Abnormal septal wall motion due to post-operative  valve. Normal LV systolic function with EF 55%. Doppler evidence of grade  II (pseudonormal) diastolic dysfunction, elevated LAP.  S/p intact aneurysm repair.  Left atrial cavity is severely dilated.  Bioprosthetic aortic valve with normal functioning. Aortic valve mean  gradient of 14 mmHg, Vmax of 2.5  m/s. Calculated aortic valve area by  continuity equation is 1.3 cm. No aortic valve regurgitation noted.  Mildly restricted mitral valve leaflets without significant stenosis. Mild  (Grade I) mitral regurgitation.  Mild to moderate tricuspid regurgitation. Peak RA-RV gradient 21 mmHg.  No significant change compared to previous study on 04/11/2019.  Carotid artery duplex 06/19/2020:  Minimal stenosis in the right internal carotid artery (minimal).  Stenosis in the left internal carotid artery (16-49%).  Right vertebral artery flow is not visualized. Left vertebral artery flow  is not visualized.  Study is limited by severe immobility of the neck.  Follow up in one year is appropriate if clinically indicated. No  significant change from 08/02/2019.   Recent labs: 05/19/2019: Glucose 101.  BUN/creatinine 16/1.1.  eGFR 63.  Sodium 140, potassium 4.1.  Rest of the CMP normal. H/H 13/42.  MCV 93.  Platelets 143.   Cholesterol 125, triglycerides 53, HDL 42, LDL 72. Apolipoprotein B 52, normal   Review of Systems  Cardiovascular: Negative for chest pain, dyspnea on exertion, leg swelling, palpitations and syncope.         Vitals:   12/21/20 1041  BP: 137/83  Pulse: 70  Resp: 16  Temp: 98.3 F (36.8 C)  SpO2: 96%      Objective:    Physical Exam Vitals and nursing note reviewed.  Constitutional:      General: He is not in acute distress.    Appearance: He is well-developed.  Neck:     Vascular: No JVD.  Cardiovascular:     Heart sounds: Murmur heard.   Harsh early systolic murmur is present with a grade of 2/6 at the upper right sternal border radiating to the neck.   Pulmonary:     Effort: Pulmonary effort is normal.  Neurological:     Mental Status: He is alert and oriented to person, place, and time.           Assessment & Recommendations:   87 y/o Caucasian male with hyperlipidemia, bioprosthetic aortic valve replacement (Carpentier-Edwards  Perimount prosthetic valve size #25) and aortic aneurysm repair in 2017 [facility], [place]), coronary artery disease s/p stent for MI in 2006, history of postop A. fib with no recurrence, asymptomatic sinus pause (08/2020), h/o head and neck cancer s/p chemotherapy & radiation in 2000s, GERD, adenomatous polyp.  Sinus pause, NSVT: Asymptomatic.  Patient currently has loop recorder, followed by Dr. Caryl Comes.  Coronary artery disease involving native coronary artery of native heart without angina pectoris No ischemia on stress testing (10/2020) Continue Aspirin, statin, amlodipine, SL NTG prn. Avoid sildenafil.   Hypertension:  Well controlled  S/P AVR (aortic valve replacement) and aortoplasty Clinically stable. Normally functioning of bioprosthetic aortic valve. Ascending aorta 4.9 cm on CTA (11/2020). Will repeat CTA in 6 months  Hepatic lesion: Suspicious lesions incidentally seen on CTA chest. Patient is aware of similar findings in the pas.t No abdominal pain or any other symptoms at this time. Defer follow up to Dr. Osborne Casco.  H/O post op Afib: In 2017.  No recurrence of A. fib.  F/u in 6 months  Shamon Lobo Esther Hardy, MD [facility] Pager: [phone] Office: [phone] If no answer Cell [phone]

## 2020-12-24 ENCOUNTER — Ambulatory Visit: Payer: Medicare Other | Admitting: Cardiology

## 2020-12-27 ENCOUNTER — Other Ambulatory Visit: Payer: Self-pay

## 2020-12-27 ENCOUNTER — Encounter (INDEPENDENT_AMBULATORY_CARE_PROVIDER_SITE_OTHER): Payer: Medicare Other | Admitting: Ophthalmology

## 2020-12-27 DIAGNOSIS — H43813 Vitreous degeneration, bilateral: Secondary | ICD-10-CM

## 2020-12-27 DIAGNOSIS — I10 Essential (primary) hypertension: Secondary | ICD-10-CM

## 2020-12-27 DIAGNOSIS — H34832 Tributary (branch) retinal vein occlusion, left eye, with macular edema: Secondary | ICD-10-CM

## 2020-12-27 DIAGNOSIS — H35033 Hypertensive retinopathy, bilateral: Secondary | ICD-10-CM

## 2021-01-16 ENCOUNTER — Ambulatory Visit (INDEPENDENT_AMBULATORY_CARE_PROVIDER_SITE_OTHER): Payer: Medicare Other

## 2021-01-16 DIAGNOSIS — I480 Paroxysmal atrial fibrillation: Secondary | ICD-10-CM

## 2021-01-16 LAB — CUP PACEART REMOTE DEVICE CHECK
Date Time Interrogation Session: 20220503184647
Implantable Pulse Generator Implant Date: 20220328

## 2021-01-28 ENCOUNTER — Telehealth: Payer: Self-pay

## 2021-01-28 NOTE — Telephone Encounter (Signed)
ILR message received "missing report." Went into Carelink and only 1 alert was noted which was not assessed. Appears (1) 4 second pause on 01/21/21 @ 19:39 pm. Called patient and he was unable to recall any symptoms. Advised patient we will continue to monitor and please call with any symptoms that arise. Patient agreeable to plan.  ILR was implanted for AF Management.

## 2021-01-30 ENCOUNTER — Ambulatory Visit: Payer: Medicare Other

## 2021-01-31 ENCOUNTER — Encounter (INDEPENDENT_AMBULATORY_CARE_PROVIDER_SITE_OTHER): Payer: Medicare Other | Admitting: Ophthalmology

## 2021-01-31 ENCOUNTER — Other Ambulatory Visit (HOSPITAL_BASED_OUTPATIENT_CLINIC_OR_DEPARTMENT_OTHER): Payer: Self-pay

## 2021-01-31 ENCOUNTER — Other Ambulatory Visit: Payer: Self-pay

## 2021-01-31 ENCOUNTER — Ambulatory Visit: Payer: Medicare Other | Attending: Internal Medicine

## 2021-01-31 DIAGNOSIS — H35033 Hypertensive retinopathy, bilateral: Secondary | ICD-10-CM

## 2021-01-31 DIAGNOSIS — H34832 Tributary (branch) retinal vein occlusion, left eye, with macular edema: Secondary | ICD-10-CM

## 2021-01-31 DIAGNOSIS — I10 Essential (primary) hypertension: Secondary | ICD-10-CM | POA: Diagnosis not present

## 2021-01-31 DIAGNOSIS — H43813 Vitreous degeneration, bilateral: Secondary | ICD-10-CM | POA: Diagnosis not present

## 2021-01-31 DIAGNOSIS — Z23 Encounter for immunization: Secondary | ICD-10-CM

## 2021-01-31 MED ORDER — PFIZER-BIONT COVID-19 VAC-TRIS 30 MCG/0.3ML IM SUSP
INTRAMUSCULAR | 0 refills | Status: DC
  Filled 2021-01-31: qty 0.3, 1d supply, fill #0

## 2021-01-31 NOTE — Progress Notes (Signed)
   Covid-19 Vaccination Clinic  Name:  Fleming Prill    MRN: [id number] DOB: [birth year]  01/31/2021  Mr. Nelles was observed post Covid-19 immunization for 15 minutes without incident. He was provided with Vaccine Information Sheet and instruction to access the V-Safe system.   Mr. Kiedrowski was instructed to call 911 with any severe reactions post vaccine: Marland Kitchen Difficulty breathing  . Swelling of face and throat  . A fast heartbeat  . A bad rash all over body  . Dizziness and weakness   Immunizations Administered    Name Date Dose VIS Date Route   PFIZER Comrnaty(Gray TOP) Covid-19 Vaccine 01/31/2021  2:01 PM 0.3 mL 08/23/2020 Intramuscular   Manufacturer: [employer], [employer]   Lot: [id number]   NDC: [phone]

## 2021-02-05 ENCOUNTER — Ambulatory Visit (INDEPENDENT_AMBULATORY_CARE_PROVIDER_SITE_OTHER): Payer: Medicare Other | Admitting: Family Medicine

## 2021-02-05 ENCOUNTER — Ambulatory Visit (INDEPENDENT_AMBULATORY_CARE_PROVIDER_SITE_OTHER): Payer: Medicare Other

## 2021-02-05 ENCOUNTER — Other Ambulatory Visit: Payer: Self-pay

## 2021-02-05 DIAGNOSIS — M545 Low back pain, unspecified: Secondary | ICD-10-CM | POA: Diagnosis not present

## 2021-02-05 NOTE — Progress Notes (Signed)
Carelink Summary Report / Loop Recorder 

## 2021-02-05 NOTE — Progress Notes (Signed)
Office Visit Note   Patient: Shaun Brown           Date of Birth: Jan 18, 1932           MRN: [id number] Visit Date: 02/05/2021 Requested by: Gaspar Garbe, MD [address] [place],  [place] [address] PCP: Wylene Simmer Adelfa Koh, MD  Subjective: Chief Complaint  Patient presents with  . Lower Back - Pain    Referred by Dr. Wylene Simmer for sacroiliitis - no relief with meloxicam    HPI: He is here at the request of Dr. Wylene Simmer for right low back/posterior hip pain.  Symptoms started a couple months ago, no definite injury.  He recalls lifting a 40 pound bag of salt at 1 point, and then later some mulch for his garden, but there was no 1 moment of injury that he can think of.  He never had a rash, no fevers or chills.  The pain at times is 5-6/10, but today it is more like a 3/10.  Prednisone helped for couple days.  He takes meloxicam right now and it helps a little bit.  Pain does not radiate to his foot, no numbness or tingling, no bowel or bladder dysfunction.  Pain gets better when he moves around.              ROS:   All other systems were reviewed and are negative.  Objective: Vital Signs: There were no vitals taken for this visit.  Physical Exam:  General:  Alert and oriented, in no acute distress. Pulm:  Breathing unlabored. Psy:  Normal mood, congruent affect. Skin: No rash Low back: No midline lumbosacral tenderness.  He does have a little tenderness over the right SI joint, and maximum tenderness to palpation of a trigger point in the gluteus medius region.  No pain with internal or external hip rotation, no tenderness over the greater trochanter.  Lower extremity strength and reflexes are normal.    Imaging: XR Lumbar Spine 2-3 Views  Result Date: 02/05/2021 X-rays show lumbar scoliosis with grade 1-2 anterolisthesis of L4 on L5.  No sign of compression fracture or neoplasm.  There is significant facet joint arthritis at multiple levels.  Sacroiliac joints and hip  joints look normal.  There is calcification of the abdominal aorta suggesting atherosclerosis.   Assessment & Plan: 1.  Right-sided low back/posterior hip pain, suspect due to myofascial gluteus medius pain but cannot rule out pain from lumbar foraminal stenosis. -His symptoms are more manageable today.  We will try physical therapy at [facility].  If he gets worse, he will call and we will bring him in for a gluteus medius trigger point injection.  MRI if he fails to improve.     Procedures: No procedures performed        PMFS History: Patient Active Problem List   Diagnosis Date Noted  . Hepatic lesion 12/21/2020  . Thoracic aortic aneurysm without rupture (HCC) 12/21/2020  . Primary hypertension 10/02/2020  . Sinus pause 10/02/2020  . Asymptomatic stenosis of left carotid artery 07/16/2020  . History of atrial fibrillation 07/16/2020  . Paroxysmal atrial fibrillation (HCC)  07/16/2020  . Left carotid bruit 07/11/2019  . Dilated aortic root (HCC) 07/11/2019  . S/P AVR (aortic valve replacement) and aortoplasty 01/06/2019  . Coronary artery disease involving native coronary artery of native heart without angina pectoris 01/06/2019   Past Medical History:  Diagnosis Date  . CAD (coronary artery disease)   . Hyperlipidemia   . MI (  myocardial infarction) [facility])     Family History  Problem Relation Age of Onset  . Diabetes Mother   . Arthritis Mother   . Leukemia Father   . Hypertension Father   . Non-Hodgkin's lymphoma Sister   . Prostate cancer Brother     Past Surgical History:  Procedure Laterality Date  . CORONARY ANGIOPLASTY  2006  . CORONARY ARTERY BYPASS GRAFT    . HERNIA REPAIR    . VALVE REPLACEMENT     Social History   Occupational History  . Not on file  Tobacco Use  . Smoking status: Former Smoker    Years: 20.00    Types: Pipe    Quit date: 01/04/2001    Years since quitting: 20.1  . Smokeless tobacco: Former Neurosurgeon    Types: Chew    Quit  date: 01/04/2001  Vaping Use  . Vaping Use: Never used  Substance and Sexual Activity  . Alcohol use: Yes    Comment: occ  . Drug use: Never  . Sexual activity: Not on file

## 2021-02-17 NOTE — Telephone Encounter (Signed)
Likely vagal in nature Given absence of symptoms, we will continue to monitor.

## 2021-02-18 ENCOUNTER — Ambulatory Visit (INDEPENDENT_AMBULATORY_CARE_PROVIDER_SITE_OTHER): Payer: Medicare Other

## 2021-02-18 ENCOUNTER — Telehealth: Payer: Self-pay | Admitting: Emergency Medicine

## 2021-02-18 DIAGNOSIS — I480 Paroxysmal atrial fibrillation: Secondary | ICD-10-CM | POA: Diagnosis not present

## 2021-02-18 LAB — CUP PACEART REMOTE DEVICE CHECK
Date Time Interrogation Session: 20220605184426
Implantable Pulse Generator Implant Date: 20220328

## 2021-02-18 NOTE — Telephone Encounter (Signed)
LMOM to call [facility] with #.

## 2021-02-21 NOTE — Telephone Encounter (Signed)
Contacted patient and advised patient of brady event. States he is not able to recall what he was doing at the time but reports he has had no symptoms the lifetime of the device. States he has felt well overall. Advised patient we will continue to monitor at this time and he is has any symptoms to please let us know. Patient agreeable to plan and verbalized understanding.

## 2021-02-28 ENCOUNTER — Other Ambulatory Visit: Payer: Self-pay

## 2021-02-28 ENCOUNTER — Encounter (INDEPENDENT_AMBULATORY_CARE_PROVIDER_SITE_OTHER): Payer: Medicare Other | Admitting: Ophthalmology

## 2021-02-28 DIAGNOSIS — H43813 Vitreous degeneration, bilateral: Secondary | ICD-10-CM | POA: Diagnosis not present

## 2021-02-28 DIAGNOSIS — H34832 Tributary (branch) retinal vein occlusion, left eye, with macular edema: Secondary | ICD-10-CM

## 2021-02-28 DIAGNOSIS — I10 Essential (primary) hypertension: Secondary | ICD-10-CM

## 2021-02-28 DIAGNOSIS — H35033 Hypertensive retinopathy, bilateral: Secondary | ICD-10-CM

## 2021-03-12 ENCOUNTER — Telehealth: Payer: Self-pay

## 2021-03-12 NOTE — Telephone Encounter (Signed)
Called to advise patient Dr. Graciela Husbands would like to set up telehealth visit to discuss recent remote transmission.   No answer, LMTCB.

## 2021-03-12 NOTE — Telephone Encounter (Signed)
-----   Message from Duke Salvia, MD sent at 03/12/2021  8:42 AM EDT ----- Normal device transmission No Recurrent syncope  Significant Arrhythmia  w pause of 4 secs  I presume there were not any assoc symptoms  We should set up at least a telehealth visit   To discuss

## 2021-03-12 NOTE — Progress Notes (Signed)
Carelink Summary Report / Loop Recorder 

## 2021-03-12 NOTE — Telephone Encounter (Signed)
-----   Message from Steven C Klein, MD sent at 03/12/2021  8:42 AM EDT ----- Normal device transmission No Recurrent syncope  Significant Arrhythmia  w pause of 4 secs  I presume there were not any assoc symptoms  We should set up at least a telehealth visit   To discuss  

## 2021-03-12 NOTE — Telephone Encounter (Signed)
Spoke with pt and advised of normal transmission.  Pt denies symptoms and states "everything is alright"  Pt advised Dr Graciela Husbands would like for pt to be scheduled for a virtual visit.  Pt is agreeable.  Will have Dr Odessa Fleming scheduler contact pt to set up appointment.  Pt verbalizes understanding and thanked Charity fundraiser for the phone call.

## 2021-03-14 NOTE — Telephone Encounter (Signed)
Spoke to patient and reports asymptomatic. Advised patient Dr. Graciela Husbands would like patient to make apt. To discuss further. Patient agreeable to plan. Advised scheduler will call to arrange.

## 2021-03-19 DIAGNOSIS — Z9889 Other specified postprocedural states: Secondary | ICD-10-CM | POA: Insufficient documentation

## 2021-03-19 NOTE — Progress Notes (Signed)
error 

## 2021-03-21 ENCOUNTER — Ambulatory Visit (INDEPENDENT_AMBULATORY_CARE_PROVIDER_SITE_OTHER): Payer: Medicare Other

## 2021-03-21 ENCOUNTER — Telehealth (INDEPENDENT_AMBULATORY_CARE_PROVIDER_SITE_OTHER): Payer: Medicare Other | Admitting: Internal Medicine

## 2021-03-21 ENCOUNTER — Encounter: Payer: Self-pay | Admitting: Internal Medicine

## 2021-03-21 ENCOUNTER — Other Ambulatory Visit: Payer: Self-pay

## 2021-03-21 VITALS — BP 133/89 | HR 62 | Ht 69.0 in | Wt 182.0 lb

## 2021-03-21 DIAGNOSIS — Z9889 Other specified postprocedural states: Secondary | ICD-10-CM | POA: Diagnosis not present

## 2021-03-21 DIAGNOSIS — I480 Paroxysmal atrial fibrillation: Secondary | ICD-10-CM

## 2021-03-21 DIAGNOSIS — I455 Other specified heart block: Secondary | ICD-10-CM | POA: Diagnosis not present

## 2021-03-21 DIAGNOSIS — I2510 Atherosclerotic heart disease of native coronary artery without angina pectoris: Secondary | ICD-10-CM | POA: Diagnosis not present

## 2021-03-21 NOTE — Patient Instructions (Signed)

## 2021-03-21 NOTE — Progress Notes (Signed)
Electrophysiology TeleHealth Note   Due to national recommendations of social distancing due to COVID 19, an audio/video telehealth visit is felt to be most appropriate for this patient at this time.  See MyChart message from today for the patient's consent to telehealth for [facility].   Date:  03/21/2021   ID:  Shaun Brown, DOB 1932-09-06, MRN [id number]  Location: patient's home  Provider location: [address], [place]  Evaluation Performed: Follow-up visit  PCP:  Tisovec, Adelfa Koh, MD  Cardiologist:     Electrophysiologist:  SK   Chief Complaint:  pause on the monitor  History of Present Illness:    Shaun Brown is a 87 y.o. male who presents via audio/video conferencing for a telehealth visit today.  Since last being seen in our clinic for abonormal event recorder with pauses of > 5 sec unassoc with symptoms for which he underwent loop recorder which demonstrated a 4 sec pause  the patient reports absolutely no symptoms on the date  The patient denies chest pain, shortness of breath, nocturnal dyspnea, orthopnea or peripheral edema.  There have been no palpitations, lightheadedness or syncope.     The patient denies symptoms of fevers, chills, cough, or new SOB worrisome for COVID 19.    Past Medical History:  Diagnosis Date   CAD (coronary artery disease)    Hyperlipidemia    MI (myocardial infarction) (HCC)     Past Surgical History:  Procedure Laterality Date   CORONARY ANGIOPLASTY  2006   CORONARY ARTERY BYPASS GRAFT     HERNIA REPAIR     VALVE REPLACEMENT      Current Outpatient Medications  Medication Sig Dispense Refill   amLODipine (NORVASC) 5 MG tablet TAKE 1 TABLET (5 MG TOTAL) BY MOUTH DAILY. 90 tablet 1   aspirin EC 81 MG tablet Take 81 mg by mouth daily.     atorvastatin (LIPITOR) 40 MG tablet Take 40 mg by mouth daily.     b complex vitamins tablet Take 1 tablet by mouth daily.     BESIVANCE 0.6 % SUSP Apply to eye.      Calcium Carbonate (CALCIUM 600 PO) Take by mouth.     Cholecalciferol 25 MCG (1000 UT) capsule Take 3,000 Units by mouth daily.     COVID-19 mRNA Vac-TriS, Pfizer, (PFIZER-BIONT COVID-19 VAC-TRIS) SUSP injection Inject into the muscle. 0.3 mL 0   meloxicam (MOBIC) 7.5 MG tablet Take 7.5 mg by mouth 2 (two) times daily.     mupirocin ointment (BACTROBAN) 2 % Apply 1 application topically daily.     nitroGLYCERIN (NITROSTAT) 0.4 MG SL tablet Place 1 tablet (0.4 mg total) under the tongue every 5 (five) minutes as needed for chest pain. 25 tablet 3   omeprazole (PRILOSEC) 20 MG capsule Take 20 mg by mouth daily.     Prednisol Ace-Moxiflox-Bromfen 1-0.5-0.075 % SUSP Place 1 drop into the right eye 4 (four) times daily.     predniSONE (DELTASONE) 20 MG tablet Take 40 mg by mouth daily. (Patient not taking: Reported on 03/21/2021)     No current facility-administered medications for this visit.    Allergies:   Tetanus toxoids and Amoxicillin   Social History:  The patient  reports that he quit smoking about 20 years ago. His smoking use included pipe. He quit smokeless tobacco use about 20 years ago.  His smokeless tobacco use included chew. He reports current alcohol use. He reports that he does not use  drugs.   Family History:  The patient's   family history includes Arthritis in his mother; Diabetes in his mother; Hypertension in his father; Leukemia in his father; Non-Hodgkin's lymphoma in his sister; Prostate cancer in his brother.   ROS:  Please see the history of present illness.   All other systems are personally reviewed and negative.    Exam:    Vital Signs:  BP 133/89   Pulse 62   Ht 5\' 9"  (1.753 m)   Wt 182 lb (82.6 kg)   BMI 26.88 kg/m      Labs/Other Tests and Data Reviewed:    Recent Labs: 10/08/2020: BUN 17; Creatinine, Ser 1.29; Hemoglobin 13.6; Platelets 135; Potassium 4.5; Sodium 140   Wt Readings from Last 3 Encounters:  03/21/21 182 lb (82.6 kg)  12/21/20 190 lb  (86.2 kg)  12/10/20 187 lb (84.8 kg)     Other studies personally reviewed: Additional studies/ records that were reviewed today include: (As above)     ASSESSMENT & PLAN:    Sinus pauses   VT-NS   CAD with prior MI   Aortic Valve replacement and Ascending aortic aneurysm   Ventricular tachycardia   Recurrent sinus pauses without symptoms Will continue to follow  No angina   NO ventricular tachycardia     COVID 19 screen The patient denies symptoms of COVID 19 at this time.  The importance of social distancing was discussed today.  Follow-up:  As Scheduled     Current medicines are reviewed at length with the patient today.   The patient does not have concerns regarding his medicines.  The following changes were made today:  none  Labs/ tests ordered today include:   No orders of the defined types were placed in this encounter.     Patient Risk:  after full review of this patients clinical status, I feel that they are at moderate risk at this time.  Today, I have spent 8 minutes with the patient with telehealth technology discussing the above.  Signed, 12/12/20, MD  03/21/2021 9:00 PM     [facility] [address] [place] [phone] (office) [phone] (fax)

## 2021-03-24 LAB — CUP PACEART REMOTE DEVICE CHECK
Date Time Interrogation Session: 20220708184345
Implantable Pulse Generator Implant Date: 20220328

## 2021-03-28 ENCOUNTER — Encounter (INDEPENDENT_AMBULATORY_CARE_PROVIDER_SITE_OTHER): Payer: Medicare Other | Admitting: Ophthalmology

## 2021-04-02 ENCOUNTER — Other Ambulatory Visit (HOSPITAL_BASED_OUTPATIENT_CLINIC_OR_DEPARTMENT_OTHER): Payer: Self-pay

## 2021-04-03 ENCOUNTER — Encounter (INDEPENDENT_AMBULATORY_CARE_PROVIDER_SITE_OTHER): Payer: Medicare Other | Admitting: Ophthalmology

## 2021-04-03 ENCOUNTER — Other Ambulatory Visit: Payer: Self-pay

## 2021-04-03 DIAGNOSIS — H43813 Vitreous degeneration, bilateral: Secondary | ICD-10-CM | POA: Diagnosis not present

## 2021-04-03 DIAGNOSIS — I10 Essential (primary) hypertension: Secondary | ICD-10-CM

## 2021-04-03 DIAGNOSIS — H35033 Hypertensive retinopathy, bilateral: Secondary | ICD-10-CM | POA: Diagnosis not present

## 2021-04-03 DIAGNOSIS — H34832 Tributary (branch) retinal vein occlusion, left eye, with macular edema: Secondary | ICD-10-CM | POA: Diagnosis not present

## 2021-04-12 NOTE — Progress Notes (Signed)
Carelink Summary Report / Loop Recorder 

## 2021-05-06 ENCOUNTER — Other Ambulatory Visit: Payer: Self-pay

## 2021-05-06 ENCOUNTER — Encounter (INDEPENDENT_AMBULATORY_CARE_PROVIDER_SITE_OTHER): Payer: Medicare Other | Admitting: Ophthalmology

## 2021-05-06 DIAGNOSIS — H35033 Hypertensive retinopathy, bilateral: Secondary | ICD-10-CM

## 2021-05-06 DIAGNOSIS — I10 Essential (primary) hypertension: Secondary | ICD-10-CM

## 2021-05-06 DIAGNOSIS — H43813 Vitreous degeneration, bilateral: Secondary | ICD-10-CM | POA: Diagnosis not present

## 2021-05-06 DIAGNOSIS — H34832 Tributary (branch) retinal vein occlusion, left eye, with macular edema: Secondary | ICD-10-CM

## 2021-05-22 ENCOUNTER — Other Ambulatory Visit: Payer: Self-pay | Admitting: Cardiology

## 2021-05-22 DIAGNOSIS — I10 Essential (primary) hypertension: Secondary | ICD-10-CM

## 2021-06-17 ENCOUNTER — Encounter (INDEPENDENT_AMBULATORY_CARE_PROVIDER_SITE_OTHER): Payer: Medicare Other | Admitting: Ophthalmology

## 2021-06-17 ENCOUNTER — Other Ambulatory Visit: Payer: Self-pay

## 2021-06-17 ENCOUNTER — Encounter (INDEPENDENT_AMBULATORY_CARE_PROVIDER_SITE_OTHER): Payer: Self-pay

## 2021-06-17 DIAGNOSIS — H34832 Tributary (branch) retinal vein occlusion, left eye, with macular edema: Secondary | ICD-10-CM

## 2021-06-17 DIAGNOSIS — H35033 Hypertensive retinopathy, bilateral: Secondary | ICD-10-CM | POA: Diagnosis not present

## 2021-06-17 DIAGNOSIS — H43813 Vitreous degeneration, bilateral: Secondary | ICD-10-CM

## 2021-06-17 DIAGNOSIS — I10 Essential (primary) hypertension: Secondary | ICD-10-CM

## 2021-07-17 ENCOUNTER — Ambulatory Visit: Payer: Medicare Other | Admitting: Cardiology

## 2021-08-05 ENCOUNTER — Encounter (INDEPENDENT_AMBULATORY_CARE_PROVIDER_SITE_OTHER): Payer: Medicare Other | Admitting: Ophthalmology

## 2021-08-06 ENCOUNTER — Encounter (INDEPENDENT_AMBULATORY_CARE_PROVIDER_SITE_OTHER): Payer: Medicare Other | Admitting: Ophthalmology

## 2021-08-06 ENCOUNTER — Other Ambulatory Visit: Payer: Self-pay

## 2021-08-06 DIAGNOSIS — H35033 Hypertensive retinopathy, bilateral: Secondary | ICD-10-CM | POA: Diagnosis not present

## 2021-08-06 DIAGNOSIS — I10 Essential (primary) hypertension: Secondary | ICD-10-CM | POA: Diagnosis not present

## 2021-08-06 DIAGNOSIS — H34832 Tributary (branch) retinal vein occlusion, left eye, with macular edema: Secondary | ICD-10-CM

## 2021-08-06 DIAGNOSIS — H43813 Vitreous degeneration, bilateral: Secondary | ICD-10-CM | POA: Diagnosis not present

## 2021-09-16 ENCOUNTER — Other Ambulatory Visit: Payer: Self-pay | Admitting: Cardiology

## 2021-09-16 DIAGNOSIS — I10 Essential (primary) hypertension: Secondary | ICD-10-CM

## 2021-10-07 ENCOUNTER — Encounter (INDEPENDENT_AMBULATORY_CARE_PROVIDER_SITE_OTHER): Payer: Medicare Other | Admitting: Ophthalmology

## 2021-10-07 ENCOUNTER — Other Ambulatory Visit: Payer: Self-pay

## 2021-10-07 DIAGNOSIS — H35033 Hypertensive retinopathy, bilateral: Secondary | ICD-10-CM

## 2021-10-07 DIAGNOSIS — I10 Essential (primary) hypertension: Secondary | ICD-10-CM | POA: Diagnosis not present

## 2021-10-07 DIAGNOSIS — H34832 Tributary (branch) retinal vein occlusion, left eye, with macular edema: Secondary | ICD-10-CM | POA: Diagnosis not present

## 2021-10-07 DIAGNOSIS — H43813 Vitreous degeneration, bilateral: Secondary | ICD-10-CM | POA: Diagnosis not present

## 2021-10-26 IMAGING — CT CT ANGIO CHEST
1 series · 18 of 32 positions shown · IV contrast (iopamidol)
Comparison: None available

CLINICAL DATA: Ascending thoracic aortic aneurysm, coronary bypass,
aortic valve replacement

EXAM:
CT ANGIOGRAPHY CHEST WITH CONTRAST
TECHNIQUE: Multidetector CT imaging of the chest was performed using the
standard protocol during bolus administration of intravenous
contrast. Multiplanar CT image reconstructions and MIPs were
obtained to evaluate the vascular anatomy.
CONTRAST:  75mL OD57G9-IIH IOPAMIDOL (OD57G9-IIH) INJECTION 76%

[Series 4: chest angio · axial · 0.74mm/px · z∈[-293,-17]mm · 18 of 100 slices shown]
[im 4/100  lung]
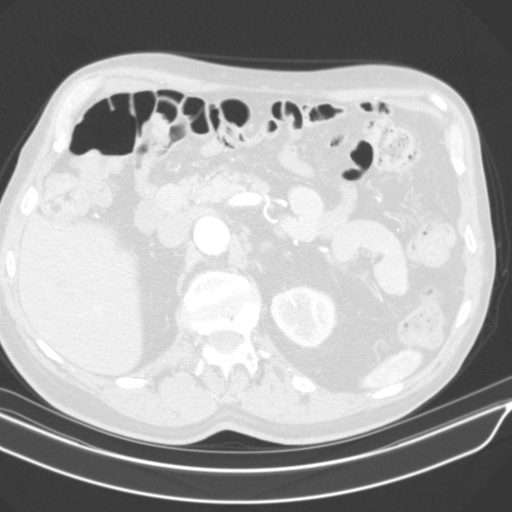
[im 10/100  soft-tissue]
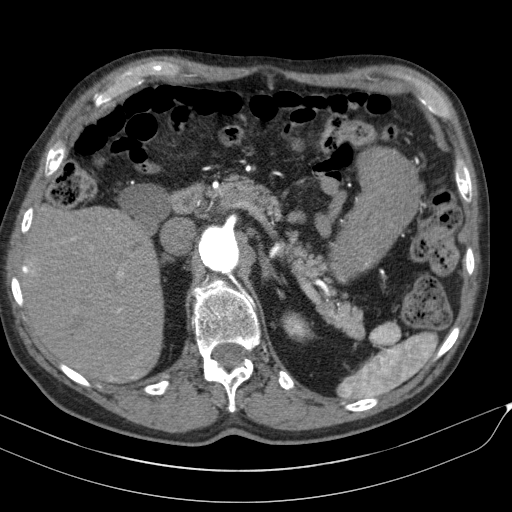
[im 16/100  lung]
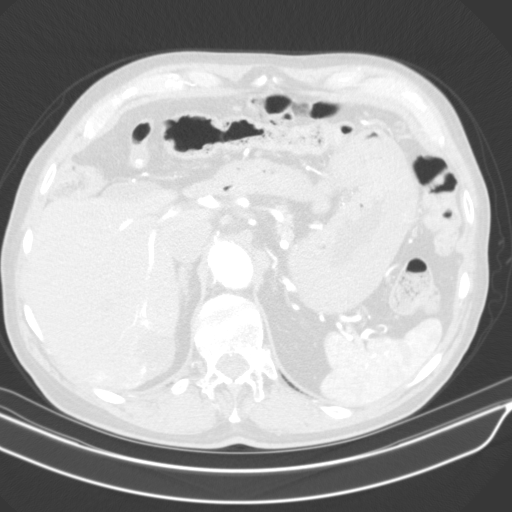
[im 20/100  soft-tissue]
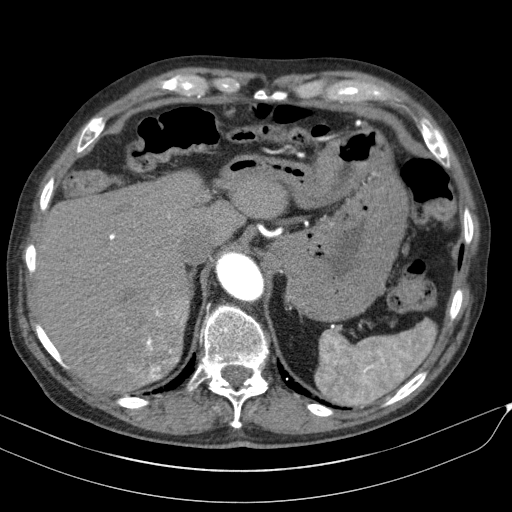
[im 26/100  lung]
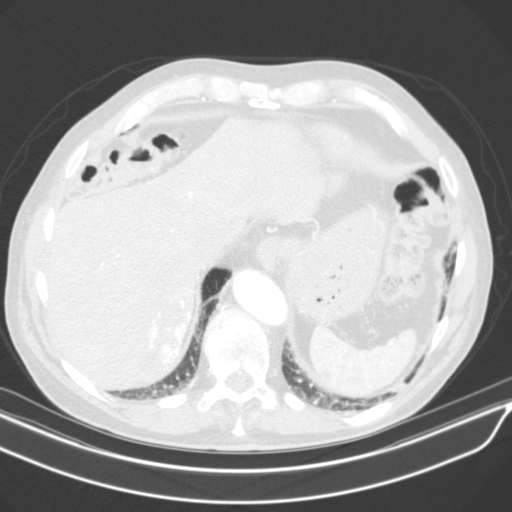
[im 32/100  soft-tissue]
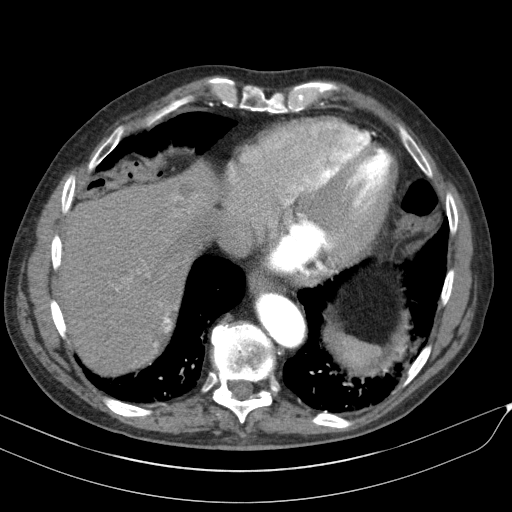
[im 36/100  lung]
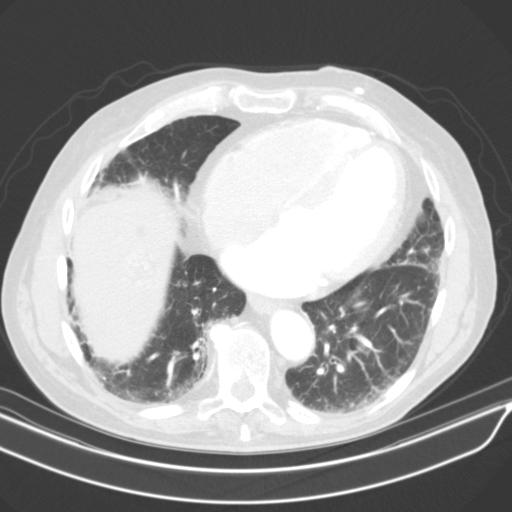
[im 42/100  soft-tissue]
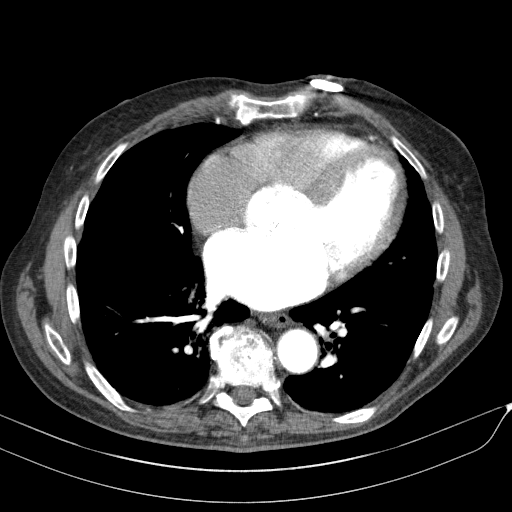
[im 48/100  lung]
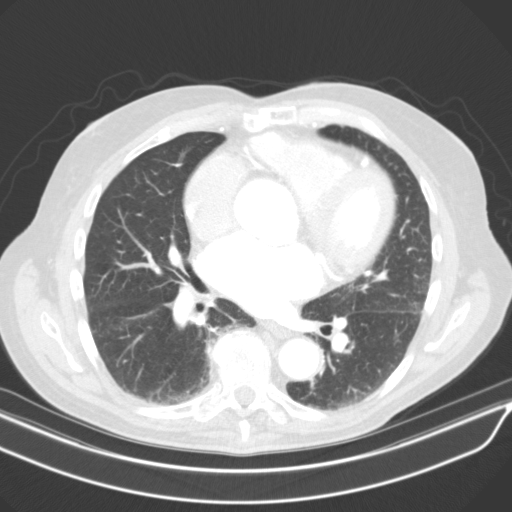
[im 52/100  soft-tissue]
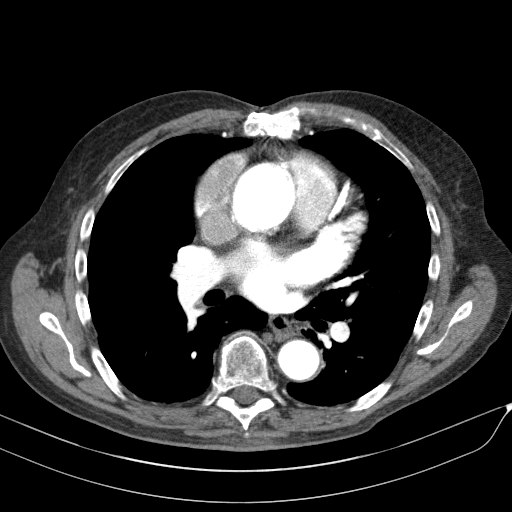
[im 58/100  lung]
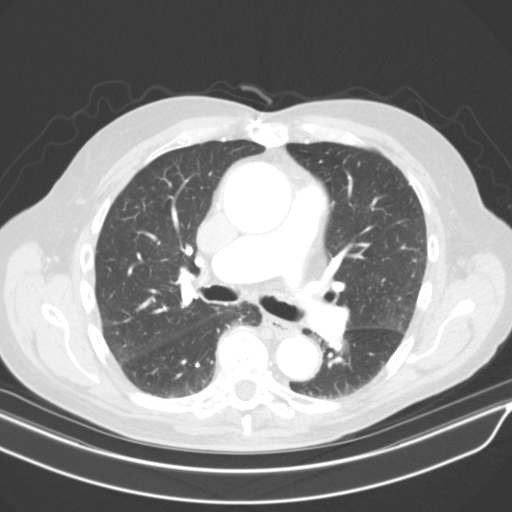
[im 64/100  soft-tissue]
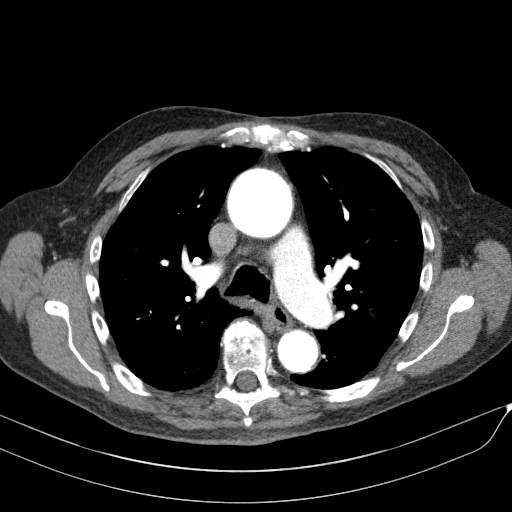
[im 68/100  lung]
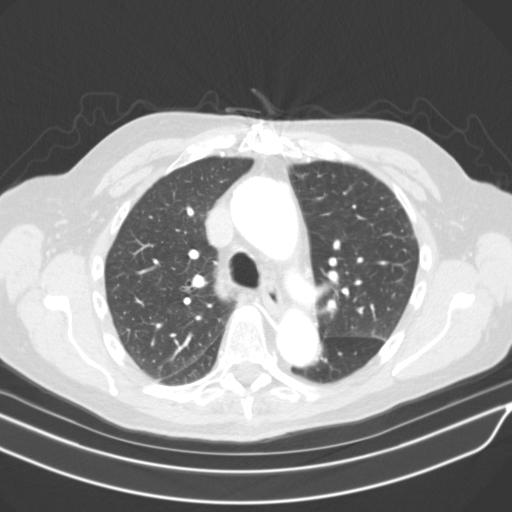
[im 74/100  soft-tissue]
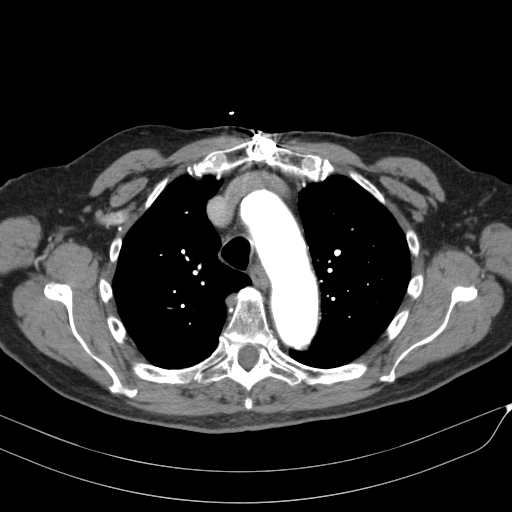
[im 80/100  lung]
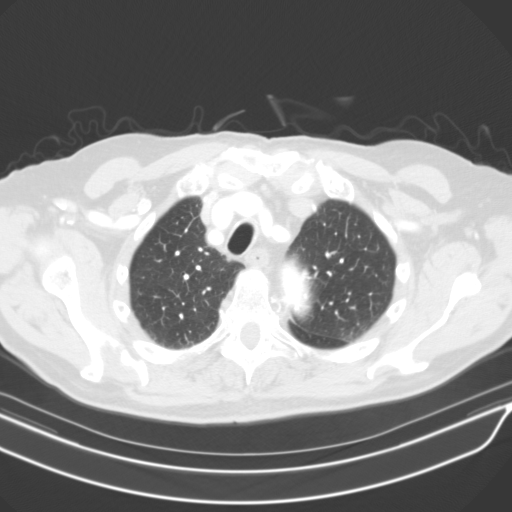
[im 84/100  soft-tissue]
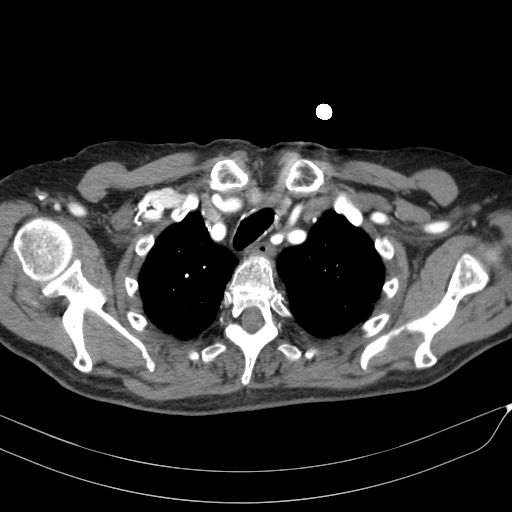
[im 90/100  lung]
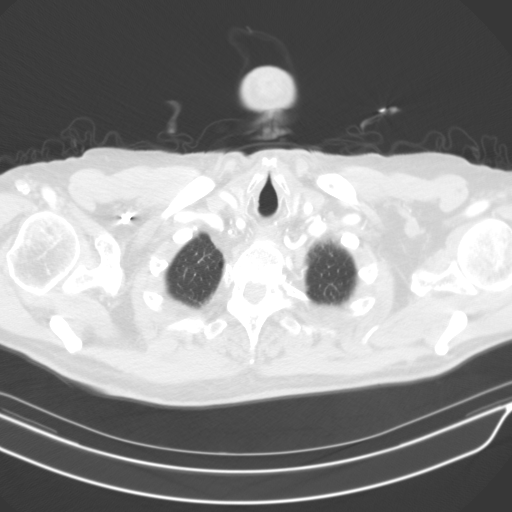
[im 96/100  soft-tissue]
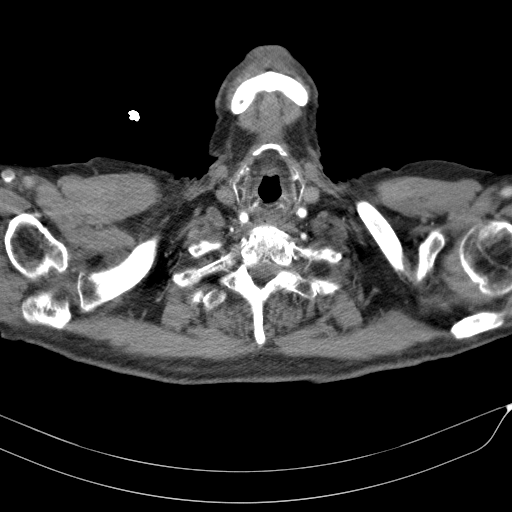

[18 of 32 positions shown; findings below may reference images not displayed]

FINDINGS: Cardiovascular: Fusiform aneurysmal dilatation of the ascending
thoracic aorta, maximal diameter 4.9 cm. Associated mild
atherosclerotic changes. Negative for dissection, mediastinal
hemorrhage, or hematoma. Aortic valve replacement evident. Remote
coronary bypass. Two vessel arch anatomy appearing patent, normal
variant. Remainder of the aorta is normal in caliber.

Pulmonary arteries appear patent. No significant filling defect or
pulmonary embolus.

Mild cardiomegaly without pericardial effusion. Native coronary
atherosclerosis noted.

Mediastinum/Nodes: No enlarged mediastinal, hilar, or axillary lymph
nodes. Thyroid gland, trachea, and esophagus demonstrate no
significant findings.

Lungs/Pleura: Punctate calcified granuloma in the left upper lobe
noted. Mild diffuse bilateral subpleural interstitial changes, worse
in the lung bases. Associated bibasilar atelectasis. Additional
punctate granulomas in the lower lobes bilaterally.

No suspicious pulmonary nodule or mass. No acute airspace process,
significant collapse or consolidation.

No pleural abnormality, effusion or pneumothorax.

Upper Abdomen: Imaged portion of the liver demonstrates multiple
right hepatic lesions with peripheral arterial enhancement and a few
scattered small hypodense lesions suspicious for cysts. Liver is
incompletely imaged to fully characterize. These may represent
scattered hemangiomas. Recommend further evaluation with nonemergent
MRI without and with contrast.

No biliary dilatation. Small gallstones noted. No acute upper
abdominal finding.

Musculoskeletal: Degenerative changes of the spine. No chest wall
abnormality or asymmetry. Remote median sternotomy. Degenerative
changes of the thoracic spine. No compression fracture.

Review of the MIP images confirms the above findings.
IMPRESSION: 4.9 cm ascending thoracic aortic aneurysm.

Ascending thoracic aortic aneurysm. Recommend semi-annual imaging
followup by CTA or MRA and referral to cardiothoracic surgery if not
already obtained. This recommendation follows 6585
ACCF/AHA/AATS/ACR/ASA/SCA/KESSINGER/ORIA-LEE/ENNIE/OURARI Guidelines for the
Diagnosis and Management of Patients With Thoracic Aortic Disease.
Circulation. 6585; 121: E266-e369. Aortic aneurysm NOS (23F1S-QLX.1)

Postop changes from aortic valve replacement coronary bypass.

Negative for significant acute central pulmonary embolus.

Mild diffuse peripheral interstitial lung disease most pronounced in
the lung bases associated basilar atelectasis.

Hepatic lesions, incompletely imaged by chest CTA as detailed above.
Consider MRI without and with contrast for further evaluation.

Aortic Atherosclerosis (23F1S-ZS7.7).

## 2021-12-16 ENCOUNTER — Encounter (INDEPENDENT_AMBULATORY_CARE_PROVIDER_SITE_OTHER): Payer: Medicare Other | Admitting: Ophthalmology

## 2021-12-16 DIAGNOSIS — H34832 Tributary (branch) retinal vein occlusion, left eye, with macular edema: Secondary | ICD-10-CM | POA: Diagnosis not present

## 2021-12-16 DIAGNOSIS — I10 Essential (primary) hypertension: Secondary | ICD-10-CM

## 2021-12-16 DIAGNOSIS — H43813 Vitreous degeneration, bilateral: Secondary | ICD-10-CM

## 2021-12-16 DIAGNOSIS — H35033 Hypertensive retinopathy, bilateral: Secondary | ICD-10-CM | POA: Diagnosis not present

## 2021-12-20 ENCOUNTER — Encounter (INDEPENDENT_AMBULATORY_CARE_PROVIDER_SITE_OTHER): Payer: Medicare Other | Admitting: Ophthalmology

## 2022-02-24 ENCOUNTER — Encounter (INDEPENDENT_AMBULATORY_CARE_PROVIDER_SITE_OTHER): Payer: Medicare Other | Admitting: Ophthalmology

## 2022-02-24 DIAGNOSIS — I10 Essential (primary) hypertension: Secondary | ICD-10-CM

## 2022-02-24 DIAGNOSIS — H43813 Vitreous degeneration, bilateral: Secondary | ICD-10-CM | POA: Diagnosis not present

## 2022-02-24 DIAGNOSIS — H34832 Tributary (branch) retinal vein occlusion, left eye, with macular edema: Secondary | ICD-10-CM

## 2022-02-24 DIAGNOSIS — H35033 Hypertensive retinopathy, bilateral: Secondary | ICD-10-CM | POA: Diagnosis not present

## 2022-03-31 ENCOUNTER — Encounter (INDEPENDENT_AMBULATORY_CARE_PROVIDER_SITE_OTHER): Payer: Medicare Other | Admitting: Ophthalmology

## 2022-03-31 DIAGNOSIS — I10 Essential (primary) hypertension: Secondary | ICD-10-CM | POA: Diagnosis not present

## 2022-03-31 DIAGNOSIS — H35033 Hypertensive retinopathy, bilateral: Secondary | ICD-10-CM | POA: Diagnosis not present

## 2022-03-31 DIAGNOSIS — H43813 Vitreous degeneration, bilateral: Secondary | ICD-10-CM | POA: Diagnosis not present

## 2022-03-31 DIAGNOSIS — H34832 Tributary (branch) retinal vein occlusion, left eye, with macular edema: Secondary | ICD-10-CM

## 2022-05-05 ENCOUNTER — Encounter (INDEPENDENT_AMBULATORY_CARE_PROVIDER_SITE_OTHER): Payer: Medicare Other | Admitting: Ophthalmology

## 2022-05-05 DIAGNOSIS — I10 Essential (primary) hypertension: Secondary | ICD-10-CM | POA: Diagnosis not present

## 2022-05-05 DIAGNOSIS — H43813 Vitreous degeneration, bilateral: Secondary | ICD-10-CM | POA: Diagnosis not present

## 2022-05-05 DIAGNOSIS — H348322 Tributary (branch) retinal vein occlusion, left eye, stable: Secondary | ICD-10-CM

## 2022-05-05 DIAGNOSIS — H35033 Hypertensive retinopathy, bilateral: Secondary | ICD-10-CM | POA: Diagnosis not present

## 2022-06-16 ENCOUNTER — Encounter (INDEPENDENT_AMBULATORY_CARE_PROVIDER_SITE_OTHER): Payer: Medicare Other | Admitting: Ophthalmology

## 2022-06-16 DIAGNOSIS — H348322 Tributary (branch) retinal vein occlusion, left eye, stable: Secondary | ICD-10-CM

## 2022-06-16 DIAGNOSIS — H35033 Hypertensive retinopathy, bilateral: Secondary | ICD-10-CM | POA: Diagnosis not present

## 2022-06-16 DIAGNOSIS — H43813 Vitreous degeneration, bilateral: Secondary | ICD-10-CM | POA: Diagnosis not present

## 2022-06-16 DIAGNOSIS — I10 Essential (primary) hypertension: Secondary | ICD-10-CM

## 2022-07-30 ENCOUNTER — Encounter (INDEPENDENT_AMBULATORY_CARE_PROVIDER_SITE_OTHER): Payer: Medicare Other | Admitting: Ophthalmology

## 2022-07-30 DIAGNOSIS — I10 Essential (primary) hypertension: Secondary | ICD-10-CM

## 2022-07-30 DIAGNOSIS — H348322 Tributary (branch) retinal vein occlusion, left eye, stable: Secondary | ICD-10-CM

## 2022-07-30 DIAGNOSIS — H43813 Vitreous degeneration, bilateral: Secondary | ICD-10-CM

## 2022-07-30 DIAGNOSIS — H35033 Hypertensive retinopathy, bilateral: Secondary | ICD-10-CM

## 2022-08-01 ENCOUNTER — Encounter (INDEPENDENT_AMBULATORY_CARE_PROVIDER_SITE_OTHER): Payer: Medicare Other | Admitting: Ophthalmology

## 2022-10-08 ENCOUNTER — Encounter (INDEPENDENT_AMBULATORY_CARE_PROVIDER_SITE_OTHER): Payer: Medicare Other | Admitting: Ophthalmology

## 2022-10-08 DIAGNOSIS — H35033 Hypertensive retinopathy, bilateral: Secondary | ICD-10-CM | POA: Diagnosis not present

## 2022-10-08 DIAGNOSIS — I10 Essential (primary) hypertension: Secondary | ICD-10-CM | POA: Diagnosis not present

## 2022-10-08 DIAGNOSIS — H43813 Vitreous degeneration, bilateral: Secondary | ICD-10-CM | POA: Diagnosis not present

## 2022-10-08 DIAGNOSIS — H348322 Tributary (branch) retinal vein occlusion, left eye, stable: Secondary | ICD-10-CM | POA: Diagnosis not present

## 2022-12-17 ENCOUNTER — Encounter (INDEPENDENT_AMBULATORY_CARE_PROVIDER_SITE_OTHER): Payer: Medicare Other | Admitting: Ophthalmology

## 2022-12-17 DIAGNOSIS — H35033 Hypertensive retinopathy, bilateral: Secondary | ICD-10-CM | POA: Diagnosis not present

## 2022-12-17 DIAGNOSIS — H348322 Tributary (branch) retinal vein occlusion, left eye, stable: Secondary | ICD-10-CM

## 2022-12-17 DIAGNOSIS — I10 Essential (primary) hypertension: Secondary | ICD-10-CM

## 2022-12-17 DIAGNOSIS — H43813 Vitreous degeneration, bilateral: Secondary | ICD-10-CM

## 2023-03-04 ENCOUNTER — Encounter (INDEPENDENT_AMBULATORY_CARE_PROVIDER_SITE_OTHER): Payer: Medicare Other | Admitting: Ophthalmology

## 2023-03-04 DIAGNOSIS — H348322 Tributary (branch) retinal vein occlusion, left eye, stable: Secondary | ICD-10-CM

## 2023-03-04 DIAGNOSIS — H43813 Vitreous degeneration, bilateral: Secondary | ICD-10-CM | POA: Diagnosis not present

## 2023-03-04 DIAGNOSIS — I10 Essential (primary) hypertension: Secondary | ICD-10-CM

## 2023-03-04 DIAGNOSIS — H35033 Hypertensive retinopathy, bilateral: Secondary | ICD-10-CM

## 2023-03-11 ENCOUNTER — Ambulatory Visit (INDEPENDENT_AMBULATORY_CARE_PROVIDER_SITE_OTHER): Payer: Medicare Other | Admitting: Internal Medicine

## 2023-03-11 ENCOUNTER — Telehealth: Payer: Self-pay

## 2023-03-11 ENCOUNTER — Ambulatory Visit: Payer: Medicare Other | Attending: Cardiology

## 2023-03-11 DIAGNOSIS — I455 Other specified heart block: Secondary | ICD-10-CM

## 2023-03-11 DIAGNOSIS — Z9889 Other specified postprocedural states: Secondary | ICD-10-CM

## 2023-03-11 LAB — CUP PACEART INCLINIC DEVICE CHECK
Date Time Interrogation Session: 20240626203705
Implantable Pulse Generator Implant Date: 20220328

## 2023-03-11 NOTE — Progress Notes (Signed)
ILR device check.  Patient has not transmitted since 2022.  He was unable to navigate his app on his phone and was brought in to clinic today to check his device and set up with a Relay monitor.  Courtney from [employer] present and assisted with change and set up with a Relay monitor.  Note on device interrogation there were 12 pauses, both during waking hours and at night, longest was 8 secs.  Also 3 noted brady events.  Dr. Graciela Husbands reviewed and saw patient in clinic today.  Refer to his office encounter for discussion and plan.

## 2023-03-11 NOTE — Progress Notes (Signed)
      Patient Care Team: Tisovec, Adelfa Koh, MD as PCP - General (Internal Medicine) Beryle Flock, PT as Physical Therapist (Physical Therapy)   HPI  Shaun Brown is a 89 y.o. male seen today in followup for an abnormal  Event Recorder personnally reviewed  >> pause > 5 secs 3 events all within 3 minutes, Also VT NS and NONE  triggered  Hx of CAD with prior MI   Implantable loop interrogated today, had lost to follow-up.  Had on 11/27/2022 a series of 3 episodes of pauses 6-8 seconds all occurring within 3 minutes.  This is similar to when it happened previously, they are associated with progressive PP prolongation prior to the onset of the first episode consistent with hyper vagotonia      DATE TEST EF    *10/21 Echo  55 % LAE severe   2/22 MYOVIEW 45% Mild LV dysfuncion 'Inf HK    Date Cr K Hgb  3/21 1.2 4.7 13.3             Records and Results Reviewed   Past Medical History:  Diagnosis Date   CAD (coronary artery disease)    Hyperlipidemia    MI (myocardial infarction) (HCC)     Past Surgical History:  Procedure Laterality Date   CORONARY ANGIOPLASTY  2006   CORONARY ARTERY BYPASS GRAFT     HERNIA REPAIR     VALVE REPLACEMENT      No outpatient medications have been marked as taking for the 03/11/23 encounter (Appointment) with Duke Salvia, MD.    Allergies  Allergen Reactions   Tetanus Toxoids    Amoxicillin Rash      Review of Systems negative except from HPI and PMH  Physical Exam There were no vitals taken for this visit. Well developed and nourished in no acute distress HENT normal Neck supple with JVP-  flat *** Clear Regular rate and rhythm, no murmurs or gallops Abd-soft with active BS No Clubbing cyanosis edema Skin-warm and dry A & Oriented  Grossly normal sensory and motor function  ECG ***   CrCl cannot be calculated (Patient's most recent lab result is older than the maximum 21 days allowed.).   Assessment and   Plan Sinus pauses   VT-NS   CAD with prior MI   Aortic Valve replacement and Ascending aortic aneurysm   Ventricular tachycardia  Without symptoms of ischemia  No syncope/LH          Current medicines are reviewed at length with the patient today .  The patient does not have concerns regarding medicines.

## 2023-03-11 NOTE — Telephone Encounter (Signed)
Per Patwardhan I tried to help the patient connect with his [employer] app. The patient did not want anything to do with the app. He wanted an appointment to get his loop check. I gave him an appointment for today at 3:20 pm. He will receive a relay monitor at his visit.

## 2023-03-12 LAB — CUP PACEART REMOTE DEVICE CHECK
Date Time Interrogation Session: 20240627000243
Implantable Pulse Generator Implant Date: 20220328
Zone Setting Status: 755011
Zone Setting Status: 755011
Zone Setting Status: 755011
Zone Setting Status: 755011

## 2023-04-22 ENCOUNTER — Ambulatory Visit: Payer: Medicare Other

## 2023-04-27 ENCOUNTER — Other Ambulatory Visit: Payer: Self-pay | Admitting: Internal Medicine

## 2023-04-27 DIAGNOSIS — M542 Cervicalgia: Secondary | ICD-10-CM

## 2023-04-28 ENCOUNTER — Ambulatory Visit
Admission: RE | Admit: 2023-04-28 | Discharge: 2023-04-28 | Disposition: A | Discharge status: Home / Self Care | Payer: Medicare Other | Source: Ambulatory Visit | Attending: Internal Medicine | Admitting: Internal Medicine

## 2023-04-28 DIAGNOSIS — M542 Cervicalgia: Secondary | ICD-10-CM

## 2023-04-28 MED ORDER — IOPAMIDOL (ISOVUE-300) INJECTION 61%
500.0000 mL | Freq: Once | INTRAVENOUS | Status: AC | PRN
  Administered 2023-04-28: 75 mL via INTRAVENOUS

## 2023-05-25 ENCOUNTER — Ambulatory Visit: Payer: Medicare Other

## 2023-05-25 DIAGNOSIS — I480 Paroxysmal atrial fibrillation: Secondary | ICD-10-CM

## 2023-05-26 LAB — CUP PACEART REMOTE DEVICE CHECK
Date Time Interrogation Session: 20240909230105
Implantable Pulse Generator Implant Date: 20220328

## 2023-05-27 ENCOUNTER — Encounter (INDEPENDENT_AMBULATORY_CARE_PROVIDER_SITE_OTHER): Payer: Medicare Other | Admitting: Ophthalmology

## 2023-05-27 DIAGNOSIS — H35033 Hypertensive retinopathy, bilateral: Secondary | ICD-10-CM | POA: Diagnosis not present

## 2023-05-27 DIAGNOSIS — H43813 Vitreous degeneration, bilateral: Secondary | ICD-10-CM

## 2023-05-27 DIAGNOSIS — I10 Essential (primary) hypertension: Secondary | ICD-10-CM

## 2023-05-27 DIAGNOSIS — H348322 Tributary (branch) retinal vein occlusion, left eye, stable: Secondary | ICD-10-CM

## 2023-06-11 NOTE — Progress Notes (Signed)
Carelink Summary Report / Loop Recorder 

## 2023-06-29 ENCOUNTER — Ambulatory Visit (INDEPENDENT_AMBULATORY_CARE_PROVIDER_SITE_OTHER): Payer: Medicare Other

## 2023-06-29 DIAGNOSIS — I480 Paroxysmal atrial fibrillation: Secondary | ICD-10-CM | POA: Diagnosis not present

## 2023-06-29 LAB — CUP PACEART REMOTE DEVICE CHECK
Date Time Interrogation Session: 20241012230327
Implantable Pulse Generator Implant Date: 20220328

## 2023-07-16 NOTE — Progress Notes (Signed)
Carelink Summary Report / Loop Recorder 

## 2023-08-03 ENCOUNTER — Ambulatory Visit (INDEPENDENT_AMBULATORY_CARE_PROVIDER_SITE_OTHER): Payer: Medicare Other

## 2023-08-03 DIAGNOSIS — I480 Paroxysmal atrial fibrillation: Secondary | ICD-10-CM

## 2023-08-03 LAB — CUP PACEART REMOTE DEVICE CHECK
Date Time Interrogation Session: 20241117230936
Implantable Pulse Generator Implant Date: 20220328

## 2023-08-18 ENCOUNTER — Encounter (INDEPENDENT_AMBULATORY_CARE_PROVIDER_SITE_OTHER): Payer: Medicare Other | Admitting: Ophthalmology

## 2023-08-18 DIAGNOSIS — H35033 Hypertensive retinopathy, bilateral: Secondary | ICD-10-CM

## 2023-08-18 DIAGNOSIS — I10 Essential (primary) hypertension: Secondary | ICD-10-CM | POA: Diagnosis not present

## 2023-08-18 DIAGNOSIS — H43813 Vitreous degeneration, bilateral: Secondary | ICD-10-CM | POA: Diagnosis not present

## 2023-08-18 DIAGNOSIS — H348322 Tributary (branch) retinal vein occlusion, left eye, stable: Secondary | ICD-10-CM | POA: Diagnosis not present

## 2023-08-19 ENCOUNTER — Encounter (INDEPENDENT_AMBULATORY_CARE_PROVIDER_SITE_OTHER): Payer: Medicare Other | Admitting: Ophthalmology

## 2023-08-28 NOTE — Progress Notes (Signed)
Carelink Summary Report / Loop Recorder 

## 2023-09-07 ENCOUNTER — Ambulatory Visit (INDEPENDENT_AMBULATORY_CARE_PROVIDER_SITE_OTHER): Payer: Medicare Other

## 2023-09-07 DIAGNOSIS — I480 Paroxysmal atrial fibrillation: Secondary | ICD-10-CM

## 2023-09-08 LAB — CUP PACEART REMOTE DEVICE CHECK
Date Time Interrogation Session: 20241222230559
Implantable Pulse Generator Implant Date: 20220328

## 2023-10-12 ENCOUNTER — Ambulatory Visit (INDEPENDENT_AMBULATORY_CARE_PROVIDER_SITE_OTHER): Payer: Medicare Other

## 2023-10-12 DIAGNOSIS — I480 Paroxysmal atrial fibrillation: Secondary | ICD-10-CM | POA: Diagnosis not present

## 2023-10-12 LAB — CUP PACEART REMOTE DEVICE CHECK
Date Time Interrogation Session: 20250126230819
Implantable Pulse Generator Implant Date: 20220328

## 2023-10-14 ENCOUNTER — Encounter: Payer: Self-pay | Admitting: Internal Medicine

## 2023-10-19 NOTE — Progress Notes (Signed)
 [facility] Summary Report / Loop Recorder

## 2023-10-19 NOTE — Addendum Note (Signed)
Addended by: Geralyn Flash D on: 10/19/2023 11:32 AM   Modules accepted: Orders

## 2023-11-16 ENCOUNTER — Encounter: Payer: Self-pay | Admitting: Internal Medicine

## 2023-11-16 ENCOUNTER — Ambulatory Visit (INDEPENDENT_AMBULATORY_CARE_PROVIDER_SITE_OTHER): Payer: Medicare Other

## 2023-11-16 DIAGNOSIS — I480 Paroxysmal atrial fibrillation: Secondary | ICD-10-CM

## 2023-11-18 LAB — CUP PACEART REMOTE DEVICE CHECK
Date Time Interrogation Session: 20250302230618
Implantable Pulse Generator Implant Date: 20220328

## 2023-11-23 NOTE — Progress Notes (Signed)
 [facility] Summary Report / Loop Recorder

## 2023-12-07 ENCOUNTER — Encounter (INDEPENDENT_AMBULATORY_CARE_PROVIDER_SITE_OTHER): Payer: Medicare Other | Admitting: Ophthalmology

## 2023-12-07 DIAGNOSIS — I10 Essential (primary) hypertension: Secondary | ICD-10-CM | POA: Diagnosis not present

## 2023-12-07 DIAGNOSIS — H35033 Hypertensive retinopathy, bilateral: Secondary | ICD-10-CM

## 2023-12-07 DIAGNOSIS — H26492 Other secondary cataract, left eye: Secondary | ICD-10-CM

## 2023-12-07 DIAGNOSIS — H43813 Vitreous degeneration, bilateral: Secondary | ICD-10-CM | POA: Diagnosis not present

## 2023-12-07 DIAGNOSIS — H348322 Tributary (branch) retinal vein occlusion, left eye, stable: Secondary | ICD-10-CM | POA: Diagnosis not present

## 2023-12-21 ENCOUNTER — Ambulatory Visit (INDEPENDENT_AMBULATORY_CARE_PROVIDER_SITE_OTHER): Payer: Medicare Other

## 2023-12-21 DIAGNOSIS — I480 Paroxysmal atrial fibrillation: Secondary | ICD-10-CM | POA: Diagnosis not present

## 2023-12-21 NOTE — Progress Notes (Signed)
 [facility] Summary Report / Loop Recorder

## 2023-12-21 NOTE — Addendum Note (Signed)
 Addended by: Geralyn Flash D on: 12/21/2023 12:57 PM   Modules accepted: Orders

## 2023-12-22 LAB — CUP PACEART REMOTE DEVICE CHECK
Date Time Interrogation Session: 20250406230517
Implantable Pulse Generator Implant Date: 20220328

## 2023-12-28 ENCOUNTER — Encounter: Payer: Self-pay | Admitting: Internal Medicine

## 2024-01-02 ENCOUNTER — Encounter: Payer: Self-pay | Admitting: Internal Medicine

## 2024-01-25 ENCOUNTER — Ambulatory Visit (INDEPENDENT_AMBULATORY_CARE_PROVIDER_SITE_OTHER): Payer: Medicare Other

## 2024-01-25 DIAGNOSIS — I480 Paroxysmal atrial fibrillation: Secondary | ICD-10-CM

## 2024-01-26 LAB — CUP PACEART REMOTE DEVICE CHECK
Date Time Interrogation Session: 20250511234511
Implantable Pulse Generator Implant Date: 20220328

## 2024-01-31 ENCOUNTER — Ambulatory Visit: Payer: Self-pay | Admitting: Cardiology

## 2024-02-10 NOTE — Progress Notes (Signed)
 [facility] Summary Report / Loop Recorder

## 2024-02-10 NOTE — Addendum Note (Signed)
 Addended by: Edra Govern D on: 02/10/2024 02:54 PM   Modules accepted: Orders

## 2024-02-25 ENCOUNTER — Ambulatory Visit

## 2024-02-25 DIAGNOSIS — I480 Paroxysmal atrial fibrillation: Secondary | ICD-10-CM

## 2024-02-27 ENCOUNTER — Ambulatory Visit: Payer: Self-pay | Admitting: Cardiology

## 2024-03-15 NOTE — Addendum Note (Signed)
 Addended by: TAWNI DRILLING D on: 03/15/2024 04:50 PM   Modules accepted: Orders

## 2024-03-15 NOTE — Progress Notes (Signed)
 [facility] Summary Report / Loop Recorder

## 2024-03-28 ENCOUNTER — Ambulatory Visit: Payer: Self-pay | Admitting: Cardiology

## 2024-03-28 ENCOUNTER — Ambulatory Visit

## 2024-03-28 ENCOUNTER — Encounter (INDEPENDENT_AMBULATORY_CARE_PROVIDER_SITE_OTHER): Admitting: Ophthalmology

## 2024-03-28 DIAGNOSIS — H348322 Tributary (branch) retinal vein occlusion, left eye, stable: Secondary | ICD-10-CM

## 2024-03-28 DIAGNOSIS — I480 Paroxysmal atrial fibrillation: Secondary | ICD-10-CM | POA: Diagnosis not present

## 2024-03-28 DIAGNOSIS — H35033 Hypertensive retinopathy, bilateral: Secondary | ICD-10-CM | POA: Diagnosis not present

## 2024-03-28 DIAGNOSIS — H43813 Vitreous degeneration, bilateral: Secondary | ICD-10-CM | POA: Diagnosis not present

## 2024-03-28 DIAGNOSIS — I10 Essential (primary) hypertension: Secondary | ICD-10-CM | POA: Diagnosis not present

## 2024-03-28 LAB — CUP PACEART REMOTE DEVICE CHECK
Date Time Interrogation Session: 20250713231820
Implantable Pulse Generator Implant Date: 20220328

## 2024-03-30 ENCOUNTER — Inpatient Hospital Stay (HOSPITAL_COMMUNITY)
Admission: EM | Admit: 2024-03-30 | Discharge: 2024-04-05 | DRG: 308 | Disposition: A | Discharge status: Home / Self Care | Attending: Internal Medicine | Admitting: Internal Medicine

## 2024-03-30 ENCOUNTER — Other Ambulatory Visit: Payer: Self-pay

## 2024-03-30 ENCOUNTER — Emergency Department (HOSPITAL_COMMUNITY)

## 2024-03-30 ENCOUNTER — Encounter (HOSPITAL_COMMUNITY): Payer: Self-pay

## 2024-03-30 DIAGNOSIS — Z951 Presence of aortocoronary bypass graft: Secondary | ICD-10-CM

## 2024-03-30 DIAGNOSIS — Z953 Presence of xenogenic heart valve: Secondary | ICD-10-CM

## 2024-03-30 DIAGNOSIS — Z8261 Family history of arthritis: Secondary | ICD-10-CM

## 2024-03-30 DIAGNOSIS — Z91018 Allergy to other foods: Secondary | ICD-10-CM

## 2024-03-30 DIAGNOSIS — I4892 Unspecified atrial flutter: Principal | ICD-10-CM | POA: Diagnosis present

## 2024-03-30 DIAGNOSIS — Z8701 Personal history of pneumonia (recurrent): Secondary | ICD-10-CM

## 2024-03-30 DIAGNOSIS — Z91014 Allergy to mammalian meats: Secondary | ICD-10-CM

## 2024-03-30 DIAGNOSIS — I495 Sick sinus syndrome: Secondary | ICD-10-CM | POA: Diagnosis present

## 2024-03-30 DIAGNOSIS — I340 Nonrheumatic mitral (valve) insufficiency: Secondary | ICD-10-CM | POA: Diagnosis present

## 2024-03-30 DIAGNOSIS — I2510 Atherosclerotic heart disease of native coronary artery without angina pectoris: Secondary | ICD-10-CM | POA: Diagnosis present

## 2024-03-30 DIAGNOSIS — Z8589 Personal history of malignant neoplasm of other organs and systems: Secondary | ICD-10-CM

## 2024-03-30 DIAGNOSIS — Z923 Personal history of irradiation: Secondary | ICD-10-CM

## 2024-03-30 DIAGNOSIS — Z955 Presence of coronary angioplasty implant and graft: Secondary | ICD-10-CM

## 2024-03-30 DIAGNOSIS — D649 Anemia, unspecified: Secondary | ICD-10-CM | POA: Diagnosis present

## 2024-03-30 DIAGNOSIS — Z8249 Family history of ischemic heart disease and other diseases of the circulatory system: Secondary | ICD-10-CM

## 2024-03-30 DIAGNOSIS — I480 Paroxysmal atrial fibrillation: Secondary | ICD-10-CM | POA: Diagnosis present

## 2024-03-30 DIAGNOSIS — Z807 Family history of other malignant neoplasms of lymphoid, hematopoietic and related tissues: Secondary | ICD-10-CM

## 2024-03-30 DIAGNOSIS — I959 Hypotension, unspecified: Secondary | ICD-10-CM | POA: Diagnosis present

## 2024-03-30 DIAGNOSIS — I70203 Unspecified atherosclerosis of native arteries of extremities, bilateral legs: Secondary | ICD-10-CM | POA: Diagnosis present

## 2024-03-30 DIAGNOSIS — R0989 Other specified symptoms and signs involving the circulatory and respiratory systems: Secondary | ICD-10-CM | POA: Diagnosis present

## 2024-03-30 DIAGNOSIS — D6869 Other thrombophilia: Secondary | ICD-10-CM | POA: Diagnosis present

## 2024-03-30 DIAGNOSIS — Z952 Presence of prosthetic heart valve: Secondary | ICD-10-CM

## 2024-03-30 DIAGNOSIS — I252 Old myocardial infarction: Secondary | ICD-10-CM

## 2024-03-30 DIAGNOSIS — Z887 Allergy status to serum and vaccine status: Secondary | ICD-10-CM

## 2024-03-30 DIAGNOSIS — I5021 Acute systolic (congestive) heart failure: Secondary | ICD-10-CM | POA: Diagnosis not present

## 2024-03-30 DIAGNOSIS — Z8042 Family history of malignant neoplasm of prostate: Secondary | ICD-10-CM

## 2024-03-30 DIAGNOSIS — I130 Hypertensive heart and chronic kidney disease with heart failure and stage 1 through stage 4 chronic kidney disease, or unspecified chronic kidney disease: Secondary | ICD-10-CM | POA: Diagnosis present

## 2024-03-30 DIAGNOSIS — Z87891 Personal history of nicotine dependence: Secondary | ICD-10-CM

## 2024-03-30 DIAGNOSIS — Z9221 Personal history of antineoplastic chemotherapy: Secondary | ICD-10-CM

## 2024-03-30 DIAGNOSIS — K219 Gastro-esophageal reflux disease without esophagitis: Secondary | ICD-10-CM | POA: Diagnosis present

## 2024-03-30 DIAGNOSIS — Z806 Family history of leukemia: Secondary | ICD-10-CM

## 2024-03-30 DIAGNOSIS — I712 Thoracic aortic aneurysm, without rupture, unspecified: Secondary | ICD-10-CM | POA: Diagnosis present

## 2024-03-30 DIAGNOSIS — Z880 Allergy status to penicillin: Secondary | ICD-10-CM

## 2024-03-30 DIAGNOSIS — N1831 Chronic kidney disease, stage 3a: Secondary | ICD-10-CM | POA: Diagnosis present

## 2024-03-30 DIAGNOSIS — Z79899 Other long term (current) drug therapy: Secondary | ICD-10-CM

## 2024-03-30 DIAGNOSIS — I429 Cardiomyopathy, unspecified: Secondary | ICD-10-CM | POA: Diagnosis present

## 2024-03-30 DIAGNOSIS — I6522 Occlusion and stenosis of left carotid artery: Secondary | ICD-10-CM | POA: Diagnosis present

## 2024-03-30 DIAGNOSIS — E7800 Pure hypercholesterolemia, unspecified: Secondary | ICD-10-CM | POA: Diagnosis present

## 2024-03-30 DIAGNOSIS — Z7982 Long term (current) use of aspirin: Secondary | ICD-10-CM

## 2024-03-30 DIAGNOSIS — Z833 Family history of diabetes mellitus: Secondary | ICD-10-CM

## 2024-03-30 DIAGNOSIS — I10 Essential (primary) hypertension: Secondary | ICD-10-CM | POA: Diagnosis present

## 2024-03-30 DIAGNOSIS — Z7901 Long term (current) use of anticoagulants: Secondary | ICD-10-CM

## 2024-03-30 LAB — CBC
HCT: 34.8 % — ABNORMAL LOW (ref 39.0–52.0)
Hemoglobin: 11.4 g/dL — ABNORMAL LOW (ref 13.0–17.0)
MCH: 28.6 pg (ref 26.0–34.0)
MCHC: 32.8 g/dL (ref 30.0–36.0)
MCV: 87.2 fL (ref 80.0–100.0)
Platelets: 208 K/uL (ref 150–400)
RBC: 3.99 MIL/uL — ABNORMAL LOW (ref 4.22–5.81)
RDW: 15.1 % (ref 11.5–15.5)
WBC: 5.2 K/uL (ref 4.0–10.5)
nRBC: 0 % (ref 0.0–0.2)

## 2024-03-30 LAB — BASIC METABOLIC PANEL WITH GFR
Anion gap: 9 (ref 5–15)
BUN: 22 mg/dL (ref 8–23)
CO2: 24 mmol/L (ref 22–32)
Calcium: 8.8 mg/dL — ABNORMAL LOW (ref 8.9–10.3)
Chloride: 104 mmol/L (ref 98–111)
Creatinine, Ser: 1.42 mg/dL — ABNORMAL HIGH (ref 0.61–1.24)
GFR, Estimated: 47 mL/min — ABNORMAL LOW (ref >60)
Glucose, Bld: 149 mg/dL — ABNORMAL HIGH (ref 70–99)
Potassium: 4 mmol/L (ref 3.5–5.1)
Sodium: 137 mmol/L (ref 135–145)

## 2024-03-30 LAB — TROPONIN I (HIGH SENSITIVITY): Troponin I (High Sensitivity): 90 ng/L — ABNORMAL HIGH (ref <18)

## 2024-03-30 NOTE — ED Triage Notes (Signed)
 Pt to ED c/o high heart rate. Denies CP, Denies SHOB. Recently treated for pneumonia, completed abx a few days ago.

## 2024-03-31 ENCOUNTER — Observation Stay (HOSPITAL_COMMUNITY)

## 2024-03-31 DIAGNOSIS — I4892 Unspecified atrial flutter: Principal | ICD-10-CM | POA: Diagnosis present

## 2024-03-31 DIAGNOSIS — I70203 Unspecified atherosclerosis of native arteries of extremities, bilateral legs: Secondary | ICD-10-CM

## 2024-03-31 DIAGNOSIS — E7800 Pure hypercholesterolemia, unspecified: Secondary | ICD-10-CM

## 2024-03-31 DIAGNOSIS — I712 Thoracic aortic aneurysm, without rupture, unspecified: Secondary | ICD-10-CM

## 2024-03-31 DIAGNOSIS — K219 Gastro-esophageal reflux disease without esophagitis: Secondary | ICD-10-CM

## 2024-03-31 DIAGNOSIS — I6522 Occlusion and stenosis of left carotid artery: Secondary | ICD-10-CM | POA: Diagnosis not present

## 2024-03-31 DIAGNOSIS — Z952 Presence of prosthetic heart valve: Secondary | ICD-10-CM

## 2024-03-31 DIAGNOSIS — I2510 Atherosclerotic heart disease of native coronary artery without angina pectoris: Secondary | ICD-10-CM

## 2024-03-31 DIAGNOSIS — N1831 Chronic kidney disease, stage 3a: Secondary | ICD-10-CM | POA: Diagnosis not present

## 2024-03-31 DIAGNOSIS — I10 Essential (primary) hypertension: Secondary | ICD-10-CM

## 2024-03-31 DIAGNOSIS — I739 Peripheral vascular disease, unspecified: Secondary | ICD-10-CM | POA: Insufficient documentation

## 2024-03-31 LAB — MAGNESIUM: Magnesium: 1.6 mg/dL — ABNORMAL LOW (ref 1.7–2.4)

## 2024-03-31 LAB — TROPONIN I (HIGH SENSITIVITY): Troponin I (High Sensitivity): 85 ng/L — ABNORMAL HIGH (ref <18)

## 2024-03-31 LAB — TSH: TSH: 6.896 u[IU]/mL — ABNORMAL HIGH (ref 0.350–4.500)

## 2024-03-31 MED ORDER — ATORVASTATIN CALCIUM 40 MG PO TABS
40.0000 mg | ORAL_TABLET | Freq: Every day | ORAL | Status: DC
  Administered 2024-03-31 – 2024-04-04 (×5): 40 mg via ORAL
  Filled 2024-03-31 (×5): qty 1

## 2024-03-31 MED ORDER — POLYETHYLENE GLYCOL 3350 17 G PO PACK: 17.0000 g | PACK | Freq: Every day | ORAL | Status: DC | PRN

## 2024-03-31 MED ORDER — SODIUM CHLORIDE 0.9% FLUSH
3.0000 mL | Freq: Two times a day (BID) | INTRAVENOUS | Status: DC
  Administered 2024-03-31 – 2024-04-05 (×9): 3 mL via INTRAVENOUS

## 2024-03-31 MED ORDER — ASPIRIN 81 MG PO TBEC
81.0000 mg | DELAYED_RELEASE_TABLET | Freq: Every day | ORAL | Status: DC
Start: 2024-04-01 — End: 2024-04-05
  Administered 2024-04-01 – 2024-04-05 (×5): 81 mg via ORAL
  Filled 2024-03-31 (×5): qty 1

## 2024-03-31 MED ORDER — ACETAMINOPHEN 325 MG PO TABS: 650.0000 mg | ORAL_TABLET | Freq: Four times a day (QID) | ORAL | Status: DC | PRN

## 2024-03-31 MED ORDER — TRAZODONE HCL 50 MG PO TABS
50.0000 mg | ORAL_TABLET | Freq: Every evening | ORAL | Status: DC | PRN
  Administered 2024-03-31 – 2024-04-05 (×2): 50 mg via ORAL
  Filled 2024-03-31 (×2): qty 1

## 2024-03-31 MED ORDER — ORAL CARE MOUTH RINSE
15.0000 mL | OROMUCOSAL | Status: DC | PRN
Start: 2024-03-31 — End: 2024-04-05

## 2024-03-31 MED ORDER — PANTOPRAZOLE SODIUM 40 MG PO TBEC
40.0000 mg | DELAYED_RELEASE_TABLET | Freq: Every day | ORAL | Status: DC
  Administered 2024-04-01 – 2024-04-05 (×5): 40 mg via ORAL
  Filled 2024-03-31 (×5): qty 1

## 2024-03-31 MED ORDER — DILTIAZEM HCL-DEXTROSE 125-5 MG/125ML-% IV SOLN (PREMIX)
5.0000 mg/h | INTRAVENOUS | Status: DC
  Administered 2024-03-31: 5 mg/h via INTRAVENOUS
  Filled 2024-03-31 (×2): qty 125

## 2024-03-31 MED ORDER — APIXABAN 2.5 MG PO TABS
2.5000 mg | ORAL_TABLET | Freq: Two times a day (BID) | ORAL | Status: DC
  Administered 2024-03-31 – 2024-04-01 (×3): 2.5 mg via ORAL
  Filled 2024-03-31 (×3): qty 1

## 2024-03-31 MED ORDER — ACETAMINOPHEN 650 MG RE SUPP
650.0000 mg | Freq: Four times a day (QID) | RECTAL | Status: DC | PRN
Start: 2024-03-31 — End: 2024-04-05

## 2024-03-31 MED ORDER — DILTIAZEM LOAD VIA INFUSION
10.0000 mg | Freq: Once | INTRAVENOUS | Status: AC
  Administered 2024-03-31: 10 mg via INTRAVENOUS
  Filled 2024-03-31: qty 10

## 2024-03-31 NOTE — Progress Notes (Signed)
 PHARMACY - ANTICOAGULATION CONSULT NOTE  Pharmacy Consult for Eliquis  Indication: atrial fibrillation  Allergies  Allergen Reactions   Tetanus Toxoids Other (See Comments)    Unknown reaction to live vaccine   Amoxil [Amoxicillin] Rash    Patient Measurements: Height: 5' 9 (175.3 cm) Weight: 84.4 kg (186 lb) IBW/kg (Calculated) : 70.7 HEPARIN DW (KG): 84.4  Vital Signs: Temp: 97.7 F (36.5 C) (07/17 1047) Temp Source: Oral (07/17 1047) BP: 111/95 (07/17 1000) Pulse Rate: 121 (07/17 1005)  Labs: Recent Labs    03/30/24 2217 03/31/24 0014  HGB 11.4*  --   HCT 34.8*  --   PLT 208  --   CREATININE 1.42*  --   TROPONINIHS 90* 85*    Estimated Creatinine Clearance: 35.3 mL/min (A) (by C-G formula based on SCr of 1.42 mg/dL (H)).   Medical History: Past Medical History:  Diagnosis Date   CAD (coronary artery disease)    Hyperlipidemia    MI (myocardial infarction) (HCC)      Assessment: 89 yoM with atrial fibrillation. He is followed by cardiology for history of atrial fibrillation, but has no record of ever taking oral anticoagulation therapy. Patient is borderline for dose reduction based on 89 years old and scr 1.42 today. Unknown baseline, but the provider has asked to be conservative for now until cardiology evaluates for long term plan.    Goal of Therapy:  Monitor platelets by anticoagulation protocol: Yes   Plan:  Eliquis  2.5mg  BID Monitor renal function and for s/sx of bleeding F/u cardiology recs for log term [facility] plan  Koren L Deagen Krass 03/31/2024,11:09 AM

## 2024-03-31 NOTE — ED Provider Notes (Signed)
 [place] EMERGENCY DEPARTMENT AT [facility] Provider Note   CSN: [id number] Arrival date & time: 03/30/24  2049     Patient presents with: Tachycardia   Shaun Brown is a 89 y.o. male.  HPI Patient is a 89 year old male to the ED today with his family, reporting tachycardia and HTN starting suddenly last night.  Previous medical history of postoperative atrial fibrillation, status post AVR, CAD, and currently has a loop recorder in place.  Noted to have been recently seen on 7/7 for pneumonia treated with cefdinir and doxycycline, completing both courses.  Denies headache, fever, dysphagia, chest pain, shortness of breath, Donnell pain, nausea, vomiting, diarrhea, melena, hematochezia, dysuria, lower leg swelling.     Prior to Admission medications   Medication Sig Start Date End Date Taking? Authorizing Provider  acetaminophen  (TYLENOL ) 500 MG tablet Take 1,000 mg by mouth 2 (two) times daily as needed for fever, headache or moderate pain (pain score 4-6).   Yes [provider]  Alcaftadine (LASTACAFT OP) Place 1 drop into both eyes daily as needed (eye irritation).   Yes [provider]  amLODipine  (NORVASC ) 5 MG tablet TAKE 1 TABLET (5 MG TOTAL) BY MOUTH DAILY. 09/17/21 05/21/24 Yes Patwardhan, Manish JINNY, MD  aspirin  EC 81 MG tablet Take 81 mg by mouth daily.   Yes [provider]  atorvastatin  (LIPITOR) 40 MG tablet Take 40 mg by mouth at bedtime. 11/04/18  Yes [provider]  b complex vitamins tablet Take 1 tablet by mouth daily.   Yes [provider]  benzonatate (TESSALON) 200 MG capsule Take 200 mg by mouth 3 (three) times daily as needed for cough. 03/23/24  Yes [provider]  Calcium  Carb-Cholecalciferol (CALCIUM  + VITAMIN D3 PO) Take 1 tablet by mouth daily.   Yes [provider]  Cholecalciferol (VITAMIN D-3 PO) Take 1 capsule by mouth daily.   Yes [provider]  nitroGLYCERIN  (NITROSTAT )  0.4 MG SL tablet Place 1 tablet (0.4 mg total) under the tongue every 5 (five) minutes as needed for chest pain. 09/18/20 05/21/24 Yes Patwardhan, Manish J, MD  omeprazole (PRILOSEC) 20 MG capsule Take 20 mg by mouth daily. 11/22/18  Yes [provider]  sodium fluoride (DENTAGEL) 1.1 % GEL dental gel Place 1 Application onto teeth at bedtime.   Yes [provider]    Allergies: Tetanus toxoids and Amoxil [amoxicillin]    Review of Systems  Cardiovascular:  Positive for palpitations.  All other systems reviewed and are negative.   Updated Vital Signs BP 118/88   Pulse (!) 122   Temp 97.7 F (36.5 C) (Oral)   Resp 13   Ht 5' 9 (1.753 m)   Wt 84.4 kg   SpO2 98%   BMI 27.47 kg/m   Physical Exam Vitals and nursing note reviewed.  Constitutional:      General: He is not in acute distress.    Appearance: Normal appearance. He is not ill-appearing or diaphoretic.  HENT:     Head: Normocephalic and atraumatic.  Eyes:     General:        Right eye: No discharge.        Left eye: No discharge.     Extraocular Movements: Extraocular movements intact.     Conjunctiva/sclera: Conjunctivae normal.  Cardiovascular:     Rate and Rhythm: Normal rate and regular rhythm.     Pulses: Normal pulses.     Heart sounds: Normal heart sounds. No murmur heard.  No friction rub. No gallop.  Pulmonary:     Effort: Pulmonary effort is normal. No respiratory distress.     Breath sounds: Normal breath sounds. No stridor. No wheezing, rhonchi or rales.  Chest:     Chest wall: No tenderness.  Abdominal:     General: Abdomen is flat. There is no distension.     Palpations: Abdomen is soft.     Tenderness: There is no abdominal tenderness. There is no guarding or rebound.  Musculoskeletal:     Cervical back: Normal range of motion and neck supple. No rigidity or tenderness.     Right lower leg: No edema.     Left lower leg: No edema.  Skin:    General: Skin is warm and dry.      Findings: No bruising, erythema or lesion.  Neurological:     General: No focal deficit present.     Mental Status: He is alert and oriented to person, place, and time. Mental status is at baseline.     Sensory: No sensory deficit.     Motor: No weakness.  Psychiatric:        Mood and Affect: Mood normal.     (all labs ordered are listed, but only abnormal results are displayed) Labs Reviewed  BASIC METABOLIC PANEL WITH GFR - Abnormal; Notable for the following components:      Result Value   Glucose, Bld 149 (*)    Creatinine, Ser 1.42 (*)    Calcium  8.8 (*)    GFR, Estimated 47 (*)    All other components within normal limits  CBC - Abnormal; Notable for the following components:   RBC 3.99 (*)    Hemoglobin 11.4 (*)    HCT 34.8 (*)    All other components within normal limits  TROPONIN I (HIGH SENSITIVITY) - Abnormal; Notable for the following components:   Troponin I (High Sensitivity) 90 (*)    All other components within normal limits  TROPONIN I (HIGH SENSITIVITY) - Abnormal; Notable for the following components:   Troponin I (High Sensitivity) 85 (*)    All other components within normal limits  MAGNESIUM   TSH    EKG: EKG Interpretation Date/Time:  Wednesday March 30 2024 22:04:48 EDT Ventricular Rate:  123 PR Interval:  96 QRS Duration:  106 QT Interval:  348 QTC Calculation: 498 R Axis:   -30  Text Interpretation: Sinus tachycardia with short PR Left axis deviation Anterior infarct , age undetermined ST & T wave abnormality, consider lateral ischemia Abnormal ECG No previous ECGs available Confirmed by Raford Lenis ([id number]) on 03/31/2024 2:03:10 AM  Radiology: DG Chest 2 View Result Date: 03/30/2024 CLINICAL DATA:  Chest pain and tachycardia EXAM: CHEST - 2 VIEW COMPARISON:  CT of the chest 12/14/2020 FINDINGS: Cardiac silhouette is enlarged. Sternotomy wires are present. Loop recorder device again seen in the anterior left chest. Prosthetic heart valve  present. There is no focal lung infiltrate, pleural effusion or pneumothorax. No acute fractures are seen. IMPRESSION: 1. No active cardiopulmonary disease. 2. Cardiomegaly. Electronically Signed   By: Greig Pique M.D.   On: 03/30/2024 22:42    Procedures   Medications Ordered in the ED  diltiazem  (CARDIZEM ) 125 mg in dextrose  5% 125 mL (1 mg/mL) infusion (7.5 mg/hr Intravenous Rate/Dose Change 03/31/24 1124)  apixaban  (ELIQUIS ) tablet 2.5 mg (2.5 mg Oral Given 03/31/24 1125)  aspirin  EC tablet 81 mg (has no administration in time range)  atorvastatin  (LIPITOR) tablet 40 mg (has  no administration in time range)  pantoprazole  (PROTONIX ) EC tablet 40 mg (has no administration in time range)  sodium chloride  flush (NS) 0.9 % injection 3 mL (has no administration in time range)  acetaminophen  (TYLENOL ) tablet 650 mg (has no administration in time range)    Or  acetaminophen  (TYLENOL ) suppository 650 mg (has no administration in time range)  polyethylene glycol (MIRALAX  / GLYCOLAX ) packet 17 g (has no administration in time range)  diltiazem  (CARDIZEM ) 1 mg/mL load via infusion 10 mg (10 mg Intravenous Bolus from Bag 03/31/24 1027)    Clinical Course as of 03/31/24 1311  Thu Mar 31, 2024  1035 Consulted with pharmacy to determine what anticoagulation to start him on, they do not feel comfortable with him having [CB]  1119 Dr Raford from cardiology consulted. Recommends starting eliquis  and rate controlling. No contraindications to [facility].  [RP]    Clinical Course User Index [CB] Aliscia Clayton S, PA-C [RP] Yolande Lamar BROCKS, MD   Medical Decision Making Amount and/or Complexity of Data Reviewed Labs: ordered. Radiology: ordered.  Risk Prescription drug management.   This patient is a 89 year old male who presents to the ED for concern of tachycardia and hypertension noticed last night, but otherwise asymptomatic.  Recently treated for pneumonia with doxycycline and cefdinir by PCP 2  weeks ago.  On physical exam, patient is in no acute distress, afebrile, alert and orient x 4, speaking in full sentences, nontachypneic.  Tachycardia present with heart rate of 120s.  LCTAB, no murmur.  No lower leg edema.  No abdominal tenderness palpation, no rashes or erythema noted, no CVA tenderness.  Unremarkable exam otherwise. Troponin was elevated with first troponin of 90, second troponin 85.  Creatinine was also slightly elevated from previous 3 years ago, however do not believe this is acutely worse.  Chest x-ray also did show a chronic cardiomegaly.  EKG did look to show AF with RVR versus atrial flutter with RVR.  Provided diltiazem  and patient heart rate normalized to 80s to 90s, showing a atrial flutter.  Contacted cardiology who suspected that the elevated troponins are likely demand ischemia as well as that this is most likely atrial flutter.  Recommended that he be put on 2.5 mg Eliquis  with a Chadvas score of 4.  Provided Eliquis  as well as continued effusion for diltiazem .  Patient care assumed by Dr. Marsa Scurry with hospitalist.   Differential diagnoses prior to evaluation: The emergent differential diagnosis includes, but is not limited to, arrhythmia, ACS, PE, pneumonia, electrolyte disturbance, sepsis. This is not an exhaustive differential.   Past Medical History / Co-morbidities / Social History: CAD, status post aortic valve replacement, PAF, HLD, MI  Additional history: Chart reviewed. Pertinent results include:   Noted to have had loop recorder interrogated on 03/28/2024 noting no arrhythmias at that time.    Lab Tests/Imaging studies: I personally interpreted labs/imaging and the pertinent results include:    CBC shows anemia with a hemoglobin 11.4. BMP shows a elevated creatinine 1.42 decreased GFR 47 Troponin initially 90, second troponin 85 Chest x-ray shows chronic cardiomegaly with no acute findings. Diltiazem , Eliquis  I agree with the  radiologist interpretation.  Cardiac monitoring: EKG obtained and interpreted by myself and attending physician which shows:            Medications: I ordered medication including Eliquis , diltiazem .  I have reviewed the patients home medicines and have made adjustments as needed.  Critical Interventions: None  Social Determinants of Health: Notably is being  followed by cardiology with having a loop recorder  Disposition: After consideration of the diagnostic results and the patients response to treatment, I feel that the patient would benefit from admission, patient care assumed by Dr. Marsa Scurry with hospitalist. Final diagnoses:  Atrial flutter with rapid ventricular response [facility])    ED Discharge Orders          Ordered    Amb referral to AFIB Clinic        03/31/24 1239               Beola Terrall GORMAN DEVONNA 03/31/24 1311    Yolande Lamar BROCKS, MD 04/02/24 [phone]

## 2024-03-31 NOTE — H&P (Signed)
 History and Physical   Shaun Brown FMW:[id number] DOB: 1932-07-07 DOA: 03/30/2024  PCP: Vernadine Charlie ORN, MD   Patient coming from: Home  Chief Complaint: Bradycardia, hypertension  HPI: Shaun Brown is a 89 y.o. male with medical history significant of hypertension, hyperlipidemia, GERD, paroxysmal atrial fibrillation, CKD 3A, CAD, coronary artery disease, PAD, thoracic aortic aneurysm, status post aortic valve replacement presenting with tachycardia and hypertension.  Patient reports sudden onset of tachycardia and associated high blood pressure starting 2 nights ago.  Patient does have known history of postoperative related atrial fibrillation.  Denies fevers, chills, chest pain, shortness of breath, abdominal pain, constipation, diarrhea, nausea, vomiting  ED Course: Vital signs in the ED notable for heart rate in the 120s.  Lab workup showed BMP with creatinine stable at 1.42, glucose 149, calcium  8.8.  CBC with hemoglobin stable 11.4.  Troponin flat at 90, 85.  Chest x-ray showed no acute normality.  Patient started on diltiazem  infusion and Eliquis .  Discussed with cardiology but not formally consulting.  Review of Systems: As per HPI otherwise all other systems reviewed and are negative.  Past Medical History:  Diagnosis Date   CAD (coronary artery disease)    Hyperlipidemia    MI (myocardial infarction) (HCC)     Past Surgical History:  Procedure Laterality Date   CORONARY ANGIOPLASTY  2006   CORONARY ARTERY BYPASS GRAFT     HERNIA REPAIR     VALVE REPLACEMENT      Social History  reports that he quit smoking about 23 years ago. His smoking use included pipe. He quit smokeless tobacco use about 23 years ago.  His smokeless tobacco use included chew. He reports current alcohol use. He reports that he does not use drugs.  Allergies  Allergen Reactions   Tetanus Toxoids Other (See Comments)    Unknown reaction to live vaccine   Amoxil [Amoxicillin] Rash     Family History  Problem Relation Age of Onset   Diabetes Mother    Arthritis Mother    Leukemia Father    Hypertension Father    Non-Hodgkin's lymphoma Sister    Prostate cancer Brother   Reviewed on admission  Prior to Admission medications   Medication Sig Start Date End Date Taking? Authorizing Provider  acetaminophen  (TYLENOL ) 500 MG tablet Take 1,000 mg by mouth 2 (two) times daily as needed for fever, headache or moderate pain (pain score 4-6).   Yes [provider]  Alcaftadine (LASTACAFT OP) Place 1 drop into both eyes daily as needed (eye irritation).   Yes [provider]  amLODipine  (NORVASC ) 5 MG tablet TAKE 1 TABLET (5 MG TOTAL) BY MOUTH DAILY. 09/17/21 05/21/24 Yes Patwardhan, Manish JINNY, MD  aspirin  EC 81 MG tablet Take 81 mg by mouth daily.   Yes [provider]  atorvastatin  (LIPITOR) 40 MG tablet Take 40 mg by mouth at bedtime. 11/04/18  Yes [provider]  b complex vitamins tablet Take 1 tablet by mouth daily.   Yes [provider]  benzonatate (TESSALON) 200 MG capsule Take 200 mg by mouth 3 (three) times daily as needed for cough. 03/23/24  Yes [provider]  Calcium  Carb-Cholecalciferol (CALCIUM  + VITAMIN D3 PO) Take 1 tablet by mouth daily.   Yes [provider]  Cholecalciferol (VITAMIN D-3 PO) Take 1 capsule by mouth daily.   Yes [provider]  nitroGLYCERIN  (NITROSTAT ) 0.4 MG SL tablet Place 1 tablet (0.4 mg total) under the tongue every 5 (five) minutes  as needed for chest pain. 09/18/20 05/21/24 Yes Patwardhan, Manish J, MD  omeprazole (PRILOSEC) 20 MG capsule Take 20 mg by mouth daily. 11/22/18  Yes [provider]  sodium fluoride (DENTAGEL) 1.1 % GEL dental gel Place 1 Application onto teeth at bedtime.   Yes [provider]  cefdinir (OMNICEF) 300 MG capsule Take 300 mg by mouth 2 (two) times daily. Patient not taking: Reported on 03/31/2024    [provider]   doxycycline (VIBRAMYCIN) 100 MG capsule Take 100 mg by mouth 2 (two) times daily. Patient not taking: Reported on 03/31/2024 03/21/24   [provider]    Physical Exam: Vitals:   03/31/24 1047 03/31/24 1100 03/31/24 1120 03/31/24 1130  BP:  105/86  (!) 109/93  Pulse:  (!) 48 (!) 122 (!) 122  Resp:  14 (!) 22 (!) 23  Temp: 97.7 F (36.5 C)     TempSrc: Oral     SpO2:  96% 99% 100%  Weight:      Height:        Physical Exam Constitutional:      General: He is not in acute distress.    Appearance: Normal appearance.  HENT:     Head: Normocephalic and atraumatic.     Mouth/Throat:     Mouth: Mucous membranes are moist.     Pharynx: Oropharynx is clear.  Eyes:     Extraocular Movements: Extraocular movements intact.     Pupils: Pupils are equal, round, and reactive to light.  Cardiovascular:     Rate and Rhythm: Tachycardia present. Rhythm irregular.     Pulses: Normal pulses.     Heart sounds: Normal heart sounds.  Pulmonary:     Effort: Pulmonary effort is normal. No respiratory distress.     Breath sounds: Normal breath sounds.  Abdominal:     General: Bowel sounds are normal. There is no distension.     Palpations: Abdomen is soft.     Tenderness: There is no abdominal tenderness.  Musculoskeletal:        General: No swelling or deformity.  Skin:    General: Skin is warm and dry.  Neurological:     General: No focal deficit present.     Mental Status: Mental status is at baseline.    Labs on Admission: I have personally reviewed following labs and imaging studies  CBC: Recent Labs  Lab 03/30/24 2217  WBC 5.2  HGB 11.4*  HCT 34.8*  MCV 87.2  PLT 208    Basic Metabolic Panel: Recent Labs  Lab 03/30/24 2217  NA 137  K 4.0  CL 104  CO2 24  GLUCOSE 149*  BUN 22  CREATININE 1.42*  CALCIUM  8.8*    GFR: Estimated Creatinine Clearance: 35.3 mL/min (A) (by C-G formula based on SCr of 1.42 mg/dL (H)).  Liver Function Tests: No results for  input(s): AST, ALT, ALKPHOS, BILITOT, PROT, ALBUMIN in the last 168 hours.  Urine analysis: No results found for: COLORURINE, APPEARANCEUR, LABSPEC, PHURINE, GLUCOSEU, HGBUR, BILIRUBINUR, KETONESUR, PROTEINUR, UROBILINOGEN, NITRITE, LEUKOCYTESUR  Radiological Exams on Admission: DG Chest 2 View Result Date: 03/30/2024 CLINICAL DATA:  Chest pain and tachycardia EXAM: CHEST - 2 VIEW COMPARISON:  CT of the chest 12/14/2020 FINDINGS: Cardiac silhouette is enlarged. Sternotomy wires are present. Loop recorder device again seen in the anterior left chest. Prosthetic heart valve present. There is no focal lung infiltrate, pleural effusion or pneumothorax. No acute fractures are seen. IMPRESSION: 1. No active cardiopulmonary disease.  2. Cardiomegaly. Electronically Signed   By: Greig Pique M.D.   On: 03/30/2024 22:42    EKG: Independently reviewed.  Sinus tachycardia with first-degree AV block with P waves frequently buried in T waves versus atrial fibrillation with RVR at 123.  Nonspecific intraventricular conduction delay, likely early left bundle branch block.  QRS 106.  Telemetry strip from later in the ED.:  Assessment/Plan Active Problems:   S/P AVR (aortic valve replacement) and aortoplasty   Coronary artery disease involving native coronary artery of native heart without angina pectoris   Left carotid bruit   Asymptomatic stenosis of left carotid artery   Primary hypertension   Thoracic aortic aneurysm without rupture (HCC)   Atherosclerosis of artery of both lower extremities (HCC)   Chronic kidney disease, stage 3a (HCC)   Gastro-esophageal reflux disease without esophagitis   Presence of prosthetic heart valve   Pure hypercholesterolemia   A-fib with RVR versus other History of atrial fibrillation > Presenting with onset of tachycardia x 1 day found to be in A-fib initially appeared to be sinus but later determined to be a atrial flutter with  RVR.  Telemetry strips in media tab of chart shows this. > Started on diltiazem  drip in the ED.  Discussed with cardiology in the ED who recommended rate control and Eliquis . - Monitor on progressive unit overnight - Continue with diltiazem  infusion - Continue Eliquis  - Echocardiogram - Check Mg, TSH - Supportive care  Hypertension - Holding amlodipine  in the setting of IV diltiazem   Hyperlipidemia - Continue home atorvastatin   GERD - Continue PPI  CKD 3A - Creatinine stable - Trend renal function and electrolytes  CAD Carotid artery disease PAD - Continue home ASA, atorvastatin  - Started on Eliquis  as above  History of thoracic aortic aneurysm History of enlarged aortic root History of aortic valve disease > Status post repair and aortic valve replacement with prosthesis  History of head neck cancer > Status post chemo, radiation - In remission  DVT prophylaxis: Eliquis  Code Status:   Full Family Communication:  None on admission  Disposition Plan:   Patient is from:  Home  Anticipated DC to:  Home  Anticipated DC date:  1 to 3 days  Anticipated DC barriers: None  Consults called:  Cardiology, discussed with EDP, not formally consulting.   Admission status:  Observation, progressive  Severity of Illness: The appropriate patient status for this patient is OBSERVATION. Observation status is judged to be reasonable and necessary in order to provide the required intensity of service to ensure the patient's safety. The patient's presenting symptoms, physical exam findings, and initial radiographic and laboratory data in the context of their medical condition is felt to place them at decreased risk for further clinical deterioration. Furthermore, it is anticipated that the patient will be medically stable for discharge from the hospital within 2 midnights of admission.    Marsa KATHEE Scurry MD [facility]  How to contact the [facility] Attending or Consulting  provider 7A - 7P or covering provider during after hours 7P -7A, for this patient?   Check the care team in [facility] and look for a) attending/consulting [facility] provider listed and b) the [facility] team listed Log into [email or web address] and use [place]'s universal password to access. If you do not have the password, please contact the hospital operator. Locate the [facility] provider you are looking for under [facility] and page to a number that you can be directly reached. If you still have difficulty reaching  the provider, please page the [facility] (Director on Call) for the Hospitalists listed on amion for assistance.  03/31/2024, 12:29 PM

## 2024-04-01 ENCOUNTER — Telehealth (HOSPITAL_COMMUNITY): Payer: Self-pay | Admitting: Pharmacy Technician

## 2024-04-01 ENCOUNTER — Other Ambulatory Visit (HOSPITAL_COMMUNITY): Payer: Self-pay

## 2024-04-01 ENCOUNTER — Observation Stay (HOSPITAL_COMMUNITY)

## 2024-04-01 DIAGNOSIS — K219 Gastro-esophageal reflux disease without esophagitis: Secondary | ICD-10-CM | POA: Diagnosis present

## 2024-04-01 DIAGNOSIS — I6522 Occlusion and stenosis of left carotid artery: Secondary | ICD-10-CM | POA: Diagnosis present

## 2024-04-01 DIAGNOSIS — Z8249 Family history of ischemic heart disease and other diseases of the circulatory system: Secondary | ICD-10-CM | POA: Diagnosis not present

## 2024-04-01 DIAGNOSIS — I4891 Unspecified atrial fibrillation: Secondary | ICD-10-CM

## 2024-04-01 DIAGNOSIS — I10 Essential (primary) hypertension: Secondary | ICD-10-CM | POA: Diagnosis not present

## 2024-04-01 DIAGNOSIS — Z953 Presence of xenogenic heart valve: Secondary | ICD-10-CM | POA: Diagnosis not present

## 2024-04-01 DIAGNOSIS — I4892 Unspecified atrial flutter: Secondary | ICD-10-CM

## 2024-04-01 DIAGNOSIS — Z833 Family history of diabetes mellitus: Secondary | ICD-10-CM | POA: Diagnosis not present

## 2024-04-01 DIAGNOSIS — Z952 Presence of prosthetic heart valve: Secondary | ICD-10-CM | POA: Diagnosis not present

## 2024-04-01 DIAGNOSIS — I5021 Acute systolic (congestive) heart failure: Secondary | ICD-10-CM | POA: Diagnosis not present

## 2024-04-01 DIAGNOSIS — Z79899 Other long term (current) drug therapy: Secondary | ICD-10-CM | POA: Diagnosis not present

## 2024-04-01 DIAGNOSIS — N1831 Chronic kidney disease, stage 3a: Secondary | ICD-10-CM | POA: Diagnosis present

## 2024-04-01 DIAGNOSIS — I130 Hypertensive heart and chronic kidney disease with heart failure and stage 1 through stage 4 chronic kidney disease, or unspecified chronic kidney disease: Secondary | ICD-10-CM | POA: Diagnosis present

## 2024-04-01 DIAGNOSIS — E7800 Pure hypercholesterolemia, unspecified: Secondary | ICD-10-CM | POA: Diagnosis present

## 2024-04-01 DIAGNOSIS — I129 Hypertensive chronic kidney disease with stage 1 through stage 4 chronic kidney disease, or unspecified chronic kidney disease: Secondary | ICD-10-CM | POA: Diagnosis not present

## 2024-04-01 DIAGNOSIS — I70203 Unspecified atherosclerosis of native arteries of extremities, bilateral legs: Secondary | ICD-10-CM | POA: Diagnosis present

## 2024-04-01 DIAGNOSIS — I361 Nonrheumatic tricuspid (valve) insufficiency: Secondary | ICD-10-CM | POA: Diagnosis not present

## 2024-04-01 DIAGNOSIS — I712 Thoracic aortic aneurysm, without rupture, unspecified: Secondary | ICD-10-CM | POA: Diagnosis present

## 2024-04-01 DIAGNOSIS — I495 Sick sinus syndrome: Secondary | ICD-10-CM

## 2024-04-01 DIAGNOSIS — R0989 Other specified symptoms and signs involving the circulatory and respiratory systems: Secondary | ICD-10-CM | POA: Diagnosis present

## 2024-04-01 DIAGNOSIS — D649 Anemia, unspecified: Secondary | ICD-10-CM | POA: Diagnosis present

## 2024-04-01 DIAGNOSIS — Z7901 Long term (current) use of anticoagulants: Secondary | ICD-10-CM | POA: Diagnosis not present

## 2024-04-01 DIAGNOSIS — I2510 Atherosclerotic heart disease of native coronary artery without angina pectoris: Secondary | ICD-10-CM | POA: Diagnosis present

## 2024-04-01 DIAGNOSIS — I959 Hypotension, unspecified: Secondary | ICD-10-CM | POA: Diagnosis present

## 2024-04-01 DIAGNOSIS — D6869 Other thrombophilia: Secondary | ICD-10-CM | POA: Diagnosis present

## 2024-04-01 DIAGNOSIS — I429 Cardiomyopathy, unspecified: Secondary | ICD-10-CM | POA: Diagnosis present

## 2024-04-01 DIAGNOSIS — I340 Nonrheumatic mitral (valve) insufficiency: Secondary | ICD-10-CM | POA: Diagnosis present

## 2024-04-01 DIAGNOSIS — I480 Paroxysmal atrial fibrillation: Secondary | ICD-10-CM | POA: Diagnosis present

## 2024-04-01 LAB — COMPREHENSIVE METABOLIC PANEL WITH GFR
ALT: 20 U/L (ref 0–44)
AST: 18 U/L (ref 15–41)
Albumin: 2.4 g/dL — ABNORMAL LOW (ref 3.5–5.0)
Alkaline Phosphatase: 40 U/L (ref 38–126)
Anion gap: 8 (ref 5–15)
BUN: 19 mg/dL (ref 8–23)
CO2: 24 mmol/L (ref 22–32)
Calcium: 8.4 mg/dL — ABNORMAL LOW (ref 8.9–10.3)
Chloride: 106 mmol/L (ref 98–111)
Creatinine, Ser: 1.28 mg/dL — ABNORMAL HIGH (ref 0.61–1.24)
GFR, Estimated: 53 mL/min — ABNORMAL LOW (ref >60)
Glucose, Bld: 100 mg/dL — ABNORMAL HIGH (ref 70–99)
Potassium: 4.2 mmol/L (ref 3.5–5.1)
Sodium: 138 mmol/L (ref 135–145)
Total Bilirubin: 0.5 mg/dL (ref 0.0–1.2)
Total Protein: 6.1 g/dL — ABNORMAL LOW (ref 6.5–8.1)

## 2024-04-01 LAB — CBC
HCT: 30.5 % — ABNORMAL LOW (ref 39.0–52.0)
Hemoglobin: 9.9 g/dL — ABNORMAL LOW (ref 13.0–17.0)
MCH: 28 pg (ref 26.0–34.0)
MCHC: 32.5 g/dL (ref 30.0–36.0)
MCV: 86.4 fL (ref 80.0–100.0)
Platelets: 181 K/uL (ref 150–400)
RBC: 3.53 MIL/uL — ABNORMAL LOW (ref 4.22–5.81)
RDW: 15.1 % (ref 11.5–15.5)
WBC: 4.4 K/uL (ref 4.0–10.5)
nRBC: 0 % (ref 0.0–0.2)

## 2024-04-01 LAB — ECHOCARDIOGRAM COMPLETE
AR max vel: 2.58 cm2
AV Area VTI: 3.25 cm2
AV Area mean vel: 2.92 cm2
AV Mean grad: 4 mmHg
AV Peak grad: 7.9 mmHg
Ao pk vel: 1.41 m/s
Area-P 1/2: 5.88 cm2
Height: 69 in
S' Lateral: 3 cm
Weight: 3072.33 [oz_av]

## 2024-04-01 LAB — T4, FREE: Free T4: 0.75 ng/dL (ref 0.61–1.12)

## 2024-04-01 MED ORDER — METOPROLOL TARTRATE 25 MG PO TABS: 25.0000 mg | ORAL_TABLET | Freq: Two times a day (BID) | ORAL | Status: DC

## 2024-04-01 MED ORDER — METOPROLOL TARTRATE 25 MG PO TABS
25.0000 mg | ORAL_TABLET | Freq: Two times a day (BID) | ORAL | Status: AC
  Administered 2024-04-01 – 2024-04-03 (×4): 25 mg via ORAL
  Filled 2024-04-01 (×4): qty 1

## 2024-04-01 MED ORDER — METOPROLOL TARTRATE 12.5 MG HALF TABLET
12.5000 mg | ORAL_TABLET | Freq: Two times a day (BID) | ORAL | Status: DC
  Administered 2024-04-01: 12.5 mg via ORAL
  Filled 2024-04-01: qty 1

## 2024-04-01 MED ORDER — LACTATED RINGERS IV BOLUS: 1000.0000 mL | Freq: Once | INTRAVENOUS | Status: DC

## 2024-04-01 MED ORDER — LACTATED RINGERS IV SOLN: INTRAVENOUS | Status: DC

## 2024-04-01 MED ORDER — MAGNESIUM SULFATE 2 GM/50ML IV SOLN
2.0000 g | INTRAVENOUS | Status: AC
  Administered 2024-04-01: 2 g via INTRAVENOUS
  Filled 2024-04-01: qty 50

## 2024-04-01 MED ORDER — APIXABAN 5 MG PO TABS
5.0000 mg | ORAL_TABLET | Freq: Two times a day (BID) | ORAL | Status: DC
  Administered 2024-04-01 – 2024-04-05 (×8): 5 mg via ORAL
  Filled 2024-04-01 (×8): qty 1

## 2024-04-01 MED ORDER — LACTATED RINGERS IV BOLUS
1000.0000 mL | INTRAVENOUS | Status: AC
  Administered 2024-04-01: 1000 mL via INTRAVENOUS

## 2024-04-01 NOTE — Consult Note (Signed)
 ELECTROPHYSIOLOGY CONSULT NOTE    Patient ID: [name] MRN: [id number], DOB/AGE: August 23, 1932 89 y.o.  Admit date: 03/30/2024 Date of Consult: 04/01/2024  Primary Physician: Tisovec, Richard W, MD Primary Cardiologist: Newman JINNY Lawrence, MD  Electrophysiologist: Dr. Cindie   Referring Provider: Dr. Fairy  Patient Profile: Shaun Brown is a 89 y.o. male with a history of AF, AAS s/p bioprosthetic AVR, CAD s/p DES (2006), PAD, thoracic aneurysm s/p repair in 2017, head and neck cancer s/p chemo/XRT with residual difficulties swallowing at times who is being seen today for the evaluation of AFL at the request of Dr. Kate.  HPI:  Shaun Brown is a 89 y.o. male who presented to [facility] ER on 7/16 with reports of elevated heart rate.  Patient reported on admission he was recently treated for pneumonia and completed cefdinir and doxycycline a couple days prior to admission.  He states the evening of admission he ate dinner and felt a little strange but could not quantify further.  He went to sit in his armchair and felt nauseated.  He asked his wife (who is a retired Char[employer] fundraiser) to check his blood pressure - his systolic blood pressure was 90 mmHg.  His Apple Watch alerted that his heart rate was elevated. They called the on-call physician and were instructed to go to the emergency room. Initial heart rates in the ER noted to be in the 120s.  Initial EKG showed AFL.  Labs-troponin 90 > 85, creatinine 1.42, Hgb 11.4.  CXR did not show acute process.  The patient was started on Eliquis  and diltiazem  IV.  He unfortunately became hypotensive on the diltiazem  and it was stopped.  He subsequently was started on low-dose Toprol per cardiology.  He denies chest pain, palpitations, dyspnea, PND, orthopnea, nausea, vomiting, dizziness, syncope, edema, weight gain, or early satiety.   Labs Potassium4.2 (07/18 9776) Magnesium  1.6* (07/17 0014) Creatinine, ser  1.28* (07/18 0223) PLT  181 (07/18  0223) HGB  9.9* (07/18 0223) WBC 4.4 (07/18 0223) Troponin I (High Sensitivity)85* (07/17 0014).    Past Medical History:  Diagnosis Date   CAD (coronary artery disease)    Hyperlipidemia    MI (myocardial infarction) (HCC)      Surgical History:  Past Surgical History:  Procedure Laterality Date   CORONARY ANGIOPLASTY  2006   CORONARY ARTERY BYPASS GRAFT     HERNIA REPAIR     VALVE REPLACEMENT       Medications Prior to Admission  Medication Sig Dispense Refill Last Dose/Taking   acetaminophen  (TYLENOL ) 500 MG tablet Take 1,000 mg by mouth 2 (two) times daily as needed for fever, headache or moderate pain (pain score 4-6).   Past Month   Alcaftadine (LASTACAFT OP) Place 1 drop into both eyes daily as needed (eye irritation).   Past Week   amLODipine  (NORVASC ) 5 MG tablet TAKE 1 TABLET (5 MG TOTAL) BY MOUTH DAILY. 90 tablet 1 03/30/2024 Morning   aspirin  EC 81 MG tablet Take 81 mg by mouth daily.   03/30/2024 Morning   atorvastatin  (LIPITOR) 40 MG tablet Take 40 mg by mouth at bedtime.   Past Week   b complex vitamins tablet Take 1 tablet by mouth daily.   03/30/2024 Morning   benzonatate (TESSALON) 200 MG capsule Take 200 mg by mouth 3 (three) times daily as needed for cough.   Past Week   Calcium  Carb-Cholecalciferol (CALCIUM  + VITAMIN D3 PO) Take 1 tablet by mouth daily.   03/30/2024 Morning  Cholecalciferol (VITAMIN D-3 PO) Take 1 capsule by mouth daily.   03/30/2024 Morning   nitroGLYCERIN  (NITROSTAT ) 0.4 MG SL tablet Place 1 tablet (0.4 mg total) under the tongue every 5 (five) minutes as needed for chest pain. 25 tablet 3 Unknown   omeprazole (PRILOSEC) 20 MG capsule Take 20 mg by mouth daily.   03/30/2024 Morning   sodium fluoride (DENTAGEL) 1.1 % GEL dental gel Place 1 Application onto teeth at bedtime.   Past Week    Inpatient Medications:   apixaban   5 mg Oral BID   aspirin  EC  81 mg Oral Daily   atorvastatin   40 mg Oral QHS   metoprolol tartrate  25 mg Oral BID    pantoprazole   40 mg Oral Daily   sodium chloride  flush  3 mL Intravenous Q12H    Allergies:  Allergies  Allergen Reactions   Beef-Derived Drug Products Other (See Comments)    Only fish for a meat. Kosher friendly   Chicken Allergy Other (See Comments)    Only fish for a meat. Kosher friendly    Pork-Derived Products Other (See Comments)    Only fish for a meat. Kosher friendly.    Tetanus Toxoids Other (See Comments)    Unknown reaction to live vaccine   Amoxil [Amoxicillin] Rash    Family History  Problem Relation Age of Onset   Diabetes Mother    Arthritis Mother    Leukemia Father    Hypertension Father    Non-Hodgkin's lymphoma Sister    Prostate cancer Brother      Physical Exam: Vitals:   04/01/24 0700 04/01/24 0730 04/01/24 1044 04/01/24 1150  BP: (!) 89/69 (!) 121/100 (!) 113/92 (!) 111/95  Pulse:  (!) 116  (!) 114  Resp:  18  18  Temp:  (!) 97.3 F (36.3 C)  (!) 97.4 F (36.3 C)  TempSrc:  Oral  Oral  SpO2:    97%  Weight:      Height:        GEN-pleasant elderly male in NAD, A&O x 3, normal affect HEENT: Normocephalic, atraumatic Lungs- CTAB, Normal effort.  Heart- irregular rate and rhythm, No M/G/R.  GI- Soft, NT, ND.  Extremities- No clubbing, cyanosis, or edema   Radiology/Studies: DG Chest 2 View Result Date: 03/30/2024 CLINICAL DATA:  Chest pain and tachycardia EXAM: CHEST - 2 VIEW COMPARISON:  CT of the chest 12/14/2020 FINDINGS: Cardiac silhouette is enlarged. Sternotomy wires are present. Loop recorder device again seen in the anterior left chest. Prosthetic heart valve present. There is no focal lung infiltrate, pleural effusion or pneumothorax. No acute fractures are seen. IMPRESSION: 1. No active cardiopulmonary disease. 2. Cardiomegaly. Electronically Signed   By: Greig Pique M.D.   On: 03/30/2024 22:42   CUP PACEART REMOTE DEVICE CHECK Result Date: 03/28/2024 ILR summary report received. Battery status OK. Normal device function. No  new symptom, tachy, brady, or pause episodes. No new AF episodes. Monthly summary reports and ROV/PRN. 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  Assessment/Plan:  Paroxysmal Atrial Flutter  CHA2DS2-VASc 4, suspect driven by recent acute illness with pneumonia -OAC for stroke  prophylaxis  -ILR reviewed, no AF/AFL through life of device up to 03/30/24 > AFL on admission  -NPO after MN on 7/20 for DCCV on 7/21  -metoprolol 25 mg BID, see below  -no role for PPM at this time, however if we have difficulties maintaining sinus rhythm may have to consider PPM to allow for adequate treatment of atrial arrhythmias -he is a very well 89 y/o gentleman, would defer to MD on candidacy for consideration of ablation  Secondary Hypercoagulable State  -continue Eliquis  5mg  BID, dose reviewed and appropriate by wt / Cr (1.28) -monitor for bleeding  Bradycardia / Pauses  -no recent episodes of bradycardia on ILR, previously followed by Dr. Fernande and felt to be vagal in nature -hold Metoprolol dose on the evening prior to DCCV > medication schedule / orders adjusted accordingly     For questions or updates, please contact [facility] Please consult [email or web address] for contact info under     Signed, Daphne Barrack, NP-C, AGACNP-BC [facility] - Electrophysiology  04/01/2024, 2:28 PM

## 2024-04-01 NOTE — Telephone Encounter (Signed)
 Pharmacy Patient Advocate Encounter  Insurance verification completed.    The patient is insured through [employer]. Patient has Medicare and is not eligible for a copay card, but may be able to apply for patient assistance or Medicare RX Payment Plan (Patient Must reach out to their plan, if eligible for payment plan), if available.    Ran test claim for Eliquis  5mg  tablets and the current 30 day co-pay is $30.00.   This test claim was processed through [facility]- copay amounts may vary at other pharmacies due to pharmacy/plan contracts, or as the patient moves through the different stages of their insurance plan.

## 2024-04-01 NOTE — Progress Notes (Addendum)
 PROGRESS NOTE    Shaun Brown  FMW:[id number] DOB: 05/26/1932 DOA: 03/30/2024 PCP: Vernadine Charlie ORN, MD  89/M with history of CAD/CABG, aortic valve replacement, hypertension, dyslipidemia, remote A-fib after CABG, PAD, thoracic aortic aneurysm presented to the ED 7/16 night with new onset tachycardia and high blood pressure as noted by his Apple Watch..  Patient denies significant symptoms noticed some nausea few days ago, recently treated for pneumonia. - In the ED noted to be in atrial flutter with RVR, troponin 90, creatinine 1.4, hemoglobin 11, chest x-ray with no acute findings. - Admitted, started on Cardizem  gtt., then complicated by hypotension, given a fluid bolus, Cardizem  discontinued, cardiology consulted   Subjective: -Feels fair, no events overnight  Assessment and Plan:  Atrial flutter with RVR - 2D echo today - TSH is mildly elevated but free T4 is normal -Hypotensive after Cardizem  yesterday - Cardiology consult, may benefit from cardioversion, was started on low-dose metoprolol - Continue Eliquis   Hypomagnesemia Repleted, recheck in a.m.  Hypertension - Amlodipine  on hold, blood pressure soft at this time  CAD/CABG PAD -Continue aspirin  and statin  CKD 3A Stable  History of thoracic aortic aneurysm History of enlarged aortic root History of aortic valve disease -Status post repair and aortic valve replacement with prosthesis  History of head neck cancer > Status post chemo, radiation - In remission   DVT prophylaxis: Eliquis  Code Status: Full code Family Communication: None present Disposition Plan:      Objective: Vitals:   04/01/24 0600 04/01/24 0630 04/01/24 0700 04/01/24 0730  BP: (!) 81/71 100/70 (!) 89/69 (!) 121/100  Pulse:    (!) 116  Resp:    18  Temp:    (!) 97.3 F (36.3 C)  TempSrc:    Oral  SpO2:      Weight:      Height:        Intake/Output Summary (Last 24 hours) at 04/01/2024 1007 Last data filed at 04/01/2024  0837 Gross per 24 hour  Intake 1538.2 ml  Output 325 ml  Net 1213.2 ml   Filed Weights   03/31/24 1028 03/31/24 1517 04/01/24 0515  Weight: 84.4 kg 86.1 kg 87.1 kg    Examination:  General exam: Appears calm and comfortable  Respiratory system: Clear to auscultation Cardiovascular system: S1 & S2 heard, irregular Abd: nondistended, soft and nontender.Normal bowel sounds heard. Central nervous system: Alert and oriented. No focal neurological deficits. Extremities: no edema Skin: No rashes Psychiatry:  Mood & affect appropriate.     Data Reviewed:   CBC: Recent Labs  Lab 03/30/24 2217 04/01/24 0223  WBC 5.2 4.4  HGB 11.4* 9.9*  HCT 34.8* 30.5*  MCV 87.2 86.4  PLT 208 181   Basic Metabolic Panel: Recent Labs  Lab 03/30/24 2217 03/31/24 0014 04/01/24 0223  NA 137  --  138  K 4.0  --  4.2  CL 104  --  106  CO2 24  --  24  GLUCOSE 149*  --  100*  BUN 22  --  19  CREATININE 1.42*  --  1.28*  CALCIUM  8.8*  --  8.4*  MG  --  1.6*  --    GFR: Estimated Creatinine Clearance: 42.8 mL/min (A) (by C-G formula based on SCr of 1.28 mg/dL (H)). Liver Function Tests: Recent Labs  Lab 04/01/24 0223  AST 18  ALT 20  ALKPHOS 40  BILITOT 0.5  PROT 6.1*  ALBUMIN 2.4*   No results for input(s): LIPASE, AMYLASE in  the last 168 hours. No results for input(s): AMMONIA in the last 168 hours. Coagulation Profile: No results for input(s): INR, PROTIME in the last 168 hours. Cardiac Enzymes: No results for input(s): CKTOTAL, CKMB, CKMBINDEX, TROPONINI in the last 168 hours. BNP (last 3 results) No results for input(s): PROBNP in the last 8760 hours. HbA1C: No results for input(s): HGBA1C in the last 72 hours. CBG: No results for input(s): GLUCAP in the last 168 hours. Lipid Profile: No results for input(s): CHOL, HDL, LDLCALC, TRIG, CHOLHDL, LDLDIRECT in the last 72 hours. Thyroid Function Tests: Recent Labs    03/31/24 0014  04/01/24 0223  TSH 6.896*  --   FREET4  --  0.75   Anemia Panel: No results for input(s): VITAMINB12, FOLATE, FERRITIN, TIBC, IRON, RETICCTPCT in the last 72 hours. Urine analysis: No results found for: COLORURINE, APPEARANCEUR, LABSPEC, PHURINE, GLUCOSEU, HGBUR, BILIRUBINUR, KETONESUR, PROTEINUR, UROBILINOGEN, NITRITE, LEUKOCYTESUR Sepsis Labs: @LABRCNTIP (procalcitonin:4,lacticidven:4)  )No results found for this or any previous visit (from the past 240 hours).   Radiology Studies: DG Chest 2 View Result Date: 03/30/2024 CLINICAL DATA:  Chest pain and tachycardia EXAM: CHEST - 2 VIEW COMPARISON:  CT of the chest 12/14/2020 FINDINGS: Cardiac silhouette is enlarged. Sternotomy wires are present. Loop recorder device again seen in the anterior left chest. Prosthetic heart valve present. There is no focal lung infiltrate, pleural effusion or pneumothorax. No acute fractures are seen. IMPRESSION: 1. No active cardiopulmonary disease. 2. Cardiomegaly. Electronically Signed   By: Greig Pique M.D.   On: 03/30/2024 22:42     Scheduled Meds:  apixaban   2.5 mg Oral BID   aspirin  EC  81 mg Oral Daily   atorvastatin   40 mg Oral QHS   metoprolol tartrate  12.5 mg Oral BID   pantoprazole   40 mg Oral Daily   sodium chloride  flush  3 mL Intravenous Q12H   Continuous Infusions:   LOS: 0 days    Time spent:    Sigurd Pac, MD [facility]   04/01/2024, 10:07 AM

## 2024-04-01 NOTE — TOC CM/SW Note (Addendum)
 Transition of Care [facility]) - Inpatient Brief Assessment   Patient Details  Name: Shaun Brown MRN: [id number] Date of Birth: 12/23/1931  [facility] of [facility]) CM/SW Contact:    Waddell Barnie Rama, RN Phone Number: 04/01/2024, 11:53 AM   Clinical Narrative: From home with spouse, has PCP and insurance on file, states has no HH services in place at this time or DME at home.  States wife will transport them home at [employer] and she is support system, states gets medications from [facility] on [address]  Pta self ambulatory.   There are no TOC needs identified  at this time.  Please place consult for TOC needs.  He will be on Eliquis , copay is 30.00 per pharmacist with advocate team.   Transition of Care Asessment: Insurance and Status: Insurance coverage has been reviewed Patient has primary care physician: Yes Home environment has been reviewed: home with wife Prior level of function:: indep Prior/Current Home Services: No current home services Social Drivers of Health Review: SDOH reviewed no interventions necessary Readmission risk has been reviewed: Yes Transition of care needs: no transition of care needs at this time

## 2024-04-01 NOTE — Progress Notes (Incomplete)
   04/01/24 0138  Vitals  BP (!) (S)  73/56  MAP (mmHg) (!) (S)  62  BP Location Right Arm  BP Method Automatic  Patient Position (if appropriate) Lying  Pulse Rate 61  ECG Heart Rate 63  Level of Consciousness  Level of Consciousness Alert  Oxygen Therapy  SpO2 95 %  O2 Device Room Air  MEWS Score  MEWS Temp 0  MEWS Systolic 2  MEWS Pulse 0  MEWS RR 0  MEWS LOC 0  MEWS Score 2  MEWS Score Color Yellow   Cardizem  gtt stopped per order parameters and Dr. Sundil, on-call for attending notified.

## 2024-04-01 NOTE — Plan of Care (Addendum)
 Received a call from RN that patient blood pressure has been dropped to 71/57 and heart rate dropped to 56.  Cardizem  drip has been on hold.  Giving 1 L of LR bolus and starting maintenance fluid.  EKG showing atrial flutter heart rate with variable AV block heart rate 66. Recommending to hold the Cardizem  drip in the setting of low blood pressure and low heart rate.  Update, spoke with on-call cardiology Dr. Reymundo who agrees to hold the Cardizem  drip in the setting of hypotension and and improvement of heart rate.  However given patient continues to have atrial flutter rate control below 110 recommended to start low-dose beta-blocker 12.5 mg twice daily as long as heart rate/blood pressure tolerates.  And ultimately patient need to follow-up outpatient cardiology for cardioversion in the future.  Cardiology team will see patient in the daytime. -Continue to hold Cardizem  drip. - Starting low-dose metoprolol 12.5 mg twice daily from 7 AM. -In the setting give LR bolus and continue maintenance fluid. Pending echocardiogram.   Cheyann Blecha, MD [facility] 04/01/2024, 2:50 AM

## 2024-04-01 NOTE — Consult Note (Addendum)
 Cardiology Consultation  Patient ID: Harjit Leider MRN: [id number]; DOB: [birth year]  Admit date: 03/30/2024 Date of Consult: 04/01/2024  PCP:  Vernadine Charlie ORN, MD   [facility] Providers Cardiologist:  Newman JINNY Lawrence, MD     Patient Profile: Shaun Brown is a 89 y.o. male with a hx of hypertension, hyperlipidemia, paroxysmal atrial fibrillation, coronary artery disease s/p DES in 2006, PAD, thoracic aortic aneurysm s/p repair in 2017 in [place] , s/p bioprosthetic AVR, GERD, CKD stage IIIa, history of sinus pauses, NSVT, s/p ILR, history of head and neck CA s/p chemo and RT in 2000s who is being seen 04/01/2024 for the evaluation of A-fib with RVR at the request of Dr. Fairy.  History of Present Illness: Mr. Derksen has past medical history as stated above.  He presented to the [facility] ED on 03/30/2024 with complaints of tachycardia and hypertension.  While in the ED the patient was found to be in A-fib/flutter with RVR, HR up to the 120s.  Relevant workup while in the ED includes: TSH elevated at 6.8, hypomagnesemia at 1.6, troponin 90 > 85, creatinine 1.42 > 1.28, CBC showed hemoglobin 11.4 > 9.9 repeat labs after IV fluid boluses.   Pending echocardiogram.   Echo from 06/2020, per chart review, showed LVEF 55%, G2DD, dilated LA, normal bioprosthetic AVR, mild MR, mild to moderate TR. Lexiscan from 10/2020 showed very small sized reversible defect in inferior region, LVEF 45%, low risk study.  Patient currently has an ILR in place. It was last interrogated 03/27/2024 and showed Normal device function. No new symptom, tachy, brady, or pause episodes. No new AF episodes.   Patient was started on IV diltiazem , low-dose Eliquis .  Patient ended up becoming hypotensive with BP as low as 71/57, HR in the 60s, IV diltiazem  was then discontinued, bolus of IV fluids was given.  Patient was then started on Lopressor 12.5 mg BID.  She was continued on home ASA 81 mg daily, Lipitor 40  mg daily, Protonix  40 mg daily.   He was recently seen by his PCP on July 7 for bilateral pneumonia and treated with cefdinir and doxycycline, completing both full antibiotic courses.  After speaking with the patient, he agrees with the history as stated above. He states that started 7/15 his Apple Watch alerted him that his HR was 120s, when his normal resting rates are in the 60s. He called his PCP who instructed him to go to the ED for further evaluation. He reports that the PNA he underwent earlier this month really took a toll on him and he believes that was what caused him to go back into A. Fib. He reports no real symptoms with his A. Fib and only was alerted because of his Apple Watch. He feels really well in the room, no symptoms presently. He has been ambulating around his room without issue. He does note that when he walks around his HR will rise a bit, his normal resting is 60s. He is also waiting on his wife to be present to make any formal medical decisions.   Past Medical History:  Diagnosis Date   CAD (coronary artery disease)    Hyperlipidemia    MI (myocardial infarction) (HCC)    Past Surgical History:  Procedure Laterality Date   CORONARY ANGIOPLASTY  2006   CORONARY ARTERY BYPASS GRAFT     HERNIA REPAIR     VALVE REPLACEMENT      Home Medications:  Prior to Admission medications  Medication Sig Start Date End Date Taking? Authorizing Provider  acetaminophen  (TYLENOL ) 500 MG tablet Take 1,000 mg by mouth 2 (two) times daily as needed for fever, headache or moderate pain (pain score 4-6).   Yes [provider]  Alcaftadine (LASTACAFT OP) Place 1 drop into both eyes daily as needed (eye irritation).   Yes [provider]  amLODipine  (NORVASC ) 5 MG tablet TAKE 1 TABLET (5 MG TOTAL) BY MOUTH DAILY. 09/17/21 05/21/24 Yes Patwardhan, Manish JINNY, MD  aspirin  EC 81 MG tablet Take 81 mg by mouth daily.   Yes [provider]  atorvastatin  (LIPITOR) 40 MG  tablet Take 40 mg by mouth at bedtime. 11/04/18  Yes [provider]  b complex vitamins tablet Take 1 tablet by mouth daily.   Yes [provider]  benzonatate (TESSALON) 200 MG capsule Take 200 mg by mouth 3 (three) times daily as needed for cough. 03/23/24  Yes [provider]  Calcium  Carb-Cholecalciferol (CALCIUM  + VITAMIN D3 PO) Take 1 tablet by mouth daily.   Yes [provider]  Cholecalciferol (VITAMIN D-3 PO) Take 1 capsule by mouth daily.   Yes [provider]  nitroGLYCERIN  (NITROSTAT ) 0.4 MG SL tablet Place 1 tablet (0.4 mg total) under the tongue every 5 (five) minutes as needed for chest pain. 09/18/20 05/21/24 Yes Patwardhan, Manish J, MD  omeprazole (PRILOSEC) 20 MG capsule Take 20 mg by mouth daily. 11/22/18  Yes [provider]  sodium fluoride (DENTAGEL) 1.1 % GEL dental gel Place 1 Application onto teeth at bedtime.   Yes [provider]   Scheduled Meds:  apixaban   5 mg Oral BID   aspirin  EC  81 mg Oral Daily   atorvastatin   40 mg Oral QHS   metoprolol tartrate  25 mg Oral BID   pantoprazole   40 mg Oral Daily   sodium chloride  flush  3 mL Intravenous Q12H   Continuous Infusions:  PRN Meds: acetaminophen  **OR** acetaminophen , mouth rinse, polyethylene glycol, traZODone   Allergies:    Allergies  Allergen Reactions   Beef-Derived Drug Products Other (See Comments)    Only fish for a meat. Kosher friendly   Chicken Allergy Other (See Comments)    Only fish for a meat. Kosher friendly    Pork-Derived Products Other (See Comments)    Only fish for a meat. Kosher friendly.    Tetanus Toxoids Other (See Comments)    Unknown reaction to live vaccine   Amoxil [Amoxicillin] Rash   Social History:   Social History   Socioeconomic History   Marital status: Married    Spouse name: Not on file   Number of children: 5   Years of education: Not on file   Highest education level: Not on file  Occupational History    Not on file  Tobacco Use   Smoking status: Former    Types: Pipe    Quit date: 01/04/2001    Years since quitting: 23.2   Smokeless tobacco: Former    Types: Chew    Quit date: 01/04/2001  Vaping Use   Vaping status: Never Used  Substance and Sexual Activity   Alcohol use: Yes    Comment: occ   Drug use: Never   Sexual activity: Not on file  Other Topics Concern   Not on file  Social History Narrative   Not on file   Social Drivers of Health   Financial Resource Strain: Not on file  Food Insecurity: No Food Insecurity (03/31/2024)  Hunger Vital Sign    Worried About Running Out of Food in the Last Year: Never true    Ran Out of Food in the Last Year: Never true  Transportation Needs: No Transportation Needs (03/31/2024)   PRAPARE - Administrator, Civil Service (Medical): No    Lack of Transportation (Non-Medical): No  Physical Activity: Not on file  Stress: Not on file  Social Connections: Socially Integrated (03/31/2024)   Social Connection and Isolation Panel    Frequency of Communication with Friends and Family: More than three times a week    Frequency of Social Gatherings with Friends and Family: More than three times a week    Attends Religious Services: More than 4 times per year    Active Member of [employer] or Organizations: Yes    Attends Engineer, structural: More than 4 times per year    Marital Status: Married  Catering manager Violence: Not At Risk (03/31/2024)   Humiliation, Afraid, Rape, and Kick questionnaire    Fear of Current or Ex-Partner: No    Emotionally Abused: No    Physically Abused: No    Sexually Abused: No    Family History:   Family History  Problem Relation Age of Onset   Diabetes Mother    Arthritis Mother    Leukemia Father    Hypertension Father    Non-Hodgkin's lymphoma Sister    Prostate cancer Brother     ROS:  Please see the history of present illness.  All other ROS reviewed and negative.     Physical  Exam/Data: Vitals:   04/01/24 0630 04/01/24 0700 04/01/24 0730 04/01/24 1044  BP: 100/70 (!) 89/69 (!) 121/100 (!) 113/92  Pulse:   (!) 116   Resp:   18   Temp:   (!) 97.3 F (36.3 C)   TempSrc:   Oral   SpO2:      Weight:      Height:       Intake/Output Summary (Last 24 hours) at 04/01/2024 1120 Last data filed at 04/01/2024 0837 Gross per 24 hour  Intake 1538.2 ml  Output 325 ml  Net 1213.2 ml      04/01/2024    5:15 AM 03/31/2024    3:17 PM 03/31/2024   10:28 AM  Last 3 Weights  Weight (lbs) 192 lb 0.3 oz 189 lb 13.1 oz 186 lb  Weight (kg) 87.1 kg 86.1 kg 84.369 kg     Body mass index is 28.36 kg/m.   General:  in no acute distress, resting comfortably  HEENT: normal Neck: no JVD Vascular: Distal pulses 2+ bilaterally Cardiac:  irregular, intermittently tachycardic  Lungs:  clear to auscultation bilaterally, no wheezing, rhonchi or rales  Abd: soft, nontender, no hepatomegaly  Ext: no edema Musculoskeletal:  No deformities Skin: warm and dry  Neuro:  no focal abnormalities noted Psych:  Normal affect   Telemetry:  Telemetry was personally reviewed and demonstrates: appearing more as atrial flutter with HR 80-100s,   Relevant CV Studies:  Echocardiogram, 04/01/2024 Ordered, pending results   Echocardiogram, 06/19/2020 Left ventricle cavity is normal in size. Moderate concentric hypertrophy  of the left ventricle. Abnormal septal wall motion due to post-operative  valve. Normal LV systolic function with EF 55%. Doppler evidence of grade  II (pseudonormal) diastolic dysfunction, elevated LAP.  S/p intact aneurysm repair.  Left atrial cavity is severely dilated. Bioprosthetic aortic valve with normal functioning.  Aortic valve mean  gradient of 14 mmHg,  Vmax of 2.5 m/s. Calculated aortic valve area by  continuity equation is 1.3 cm. No aortic valve regurgitation noted.  Mildly restricted mitral valve leaflets without significant stenosis. Mild  (Grade I)  mitral regurgitation.  Mild to moderate tricuspid regurgitation. Peak RA-RV gradient 21 mmHg.  No significant change compared to previous study on 04/11/2019.   Lexiscan, 10/22/2020 Nondiagnostic ECG stress. There is a very small sized reversible defect in the inferior region.  Overall LV systolic function is abnormal with hypokinesis of the mid inferior wall and basal inferior wall.  Stress LV EF: 45%.  No previous exam available for comparison. Low risk  Laboratory Data: High Sensitivity Troponin:   Recent Labs  Lab 03/30/24 2217 03/31/24 0014  TROPONINIHS 90* 85*     Chemistry Recent Labs  Lab 03/30/24 2217 03/31/24 0014 04/01/24 0223  NA 137  --  138  K 4.0  --  4.2  CL 104  --  106  CO2 24  --  24  GLUCOSE 149*  --  100*  BUN 22  --  19  CREATININE 1.42*  --  1.28*  CALCIUM  8.8*  --  8.4*  MG  --  1.6*  --   GFRNONAA 47*  --  53*  ANIONGAP 9  --  8    Recent Labs  Lab 04/01/24 0223  PROT 6.1*  ALBUMIN 2.4*  AST 18  ALT 20  ALKPHOS 40  BILITOT 0.5   Lipids No results for input(s): CHOL, TRIG, HDL, LABVLDL, LDLCALC, CHOLHDL in the last 168 hours.  Hematology Recent Labs  Lab 03/30/24 2217 04/01/24 0223  WBC 5.2 4.4  RBC 3.99* 3.53*  HGB 11.4* 9.9*  HCT 34.8* 30.5*  MCV 87.2 86.4  MCH 28.6 28.0  MCHC 32.8 32.5  RDW 15.1 15.1  PLT 208 181   Thyroid  Recent Labs  Lab 03/31/24 0014 04/01/24 0223  TSH 6.896*  --   FREET4  --  0.75    BNPNo results for input(s): BNP, PROBNP in the last 168 hours.  DDimer No results for input(s): DDIMER in the last 168 hours.  Radiology/Studies:  DG Chest 2 View Result Date: 03/30/2024 CLINICAL DATA:  Chest pain and tachycardia EXAM: CHEST - 2 VIEW COMPARISON:  CT of the chest 12/14/2020 FINDINGS: Cardiac silhouette is enlarged. Sternotomy wires are present. Loop recorder device again seen in the anterior left chest. Prosthetic heart valve present. There is no focal lung infiltrate, pleural  effusion or pneumothorax. No acute fractures are seen. IMPRESSION: 1. No active cardiopulmonary disease. 2. Cardiomegaly. Electronically Signed   By: Greig Pique M.D.   On: 03/30/2024 22:42   Assessment and Plan:  Aflutter with RVR History of sinus pauses, NSVT s/p ILR  Hypertension  Remote history of post-op A. Fib Interrogation of ILR from 7/13 showed no A. Fib/flutter Came in AFL with HR 120s Started on IV diltiazem , became hypotensive, drip stopped, IV fluids given Currently in atrial fibrillation with HR 80-100s TSH elevated, T4 normal K normal, mag low repleted  Most recent BP 113/92 with HR 80-90s  Currently on PO Lopressor 12.5 mg BID  Reached out to have ILR interrogated  Continue Eliquis  Pending updated echocardiogram Given his history of sinus pauses and now with a flutter with RVR, concern for tachybrady syndrome and recommend EP evaluation  CAD s/p DES in 2006 Hyperlipidemia  Lexiscan from 2022 was low risk  No active or prior chest pain Continue ASA 81 mg daily Continue Lipitor 40 mg  daily  History of thoracic aortic aneurysm s/p repair in 2017 s/p bioprosthetic AVR  Stable per last echo 2021 Pending updated echo   Per primary GERD CKD stage 3a PAD Electrolyte abnormalities   Risk Assessment/Risk Scores:     CHA2DS2-VASc Score = 4   This indicates a 4.8% annual risk of stroke. The patient's score is based upon: CHF History: 0 HTN History: 1 Diabetes History: 0 Stroke History: 0 Vascular Disease History: 1 Age Score: 2 Gender Score: 0      For questions or updates, please contact [facility] Please consult [email or web address] for contact info under    Signed, Waddell DELENA Donath, PA-C  04/01/2024 11:20 AM  Patient seen and examined.  Agree with above documentation.  Mr. Mavis is an 89 year old male with a history of CAD status post DES in 2006, paroxysmal atrial fibrillation, PAD, thoracic aortic aneurysm status postrepair in 2017, status  post bioprosthetic AVR, CKD stage III AAA, NSVT, history of sinus pauses status post ILR, head and neck cancer status post chemo and radiation we are consulted by Dr. Fairy for evaluation of atrial flutter.  He presented with tachycardia.  Initial vital signs in ED notable for BP 133/104, pulse 122.  EKG shows atrial flutter, rate 123.  He was started on diltiazem  drip, follow-up EKG shows atrial flutter with rate 66.  He became hypotensive down to SBP 70s on diltiazem  drip.  Initial labs notable for creatinine 1.42, troponin 90 >85, Hgb 11.4.  Chest x-ray unremarkable.  On exam, patient is alert and oriented, irregular rhythm, tachycardic, no murmurs, lungs CTAB, no LE edema or JVD.  For his atrial flutter, he has been started on Eliquis  for anticoagulation.  Echocardiogram pending.  Became hypotensive on diltiazem  drip, has been started on low-dose metoprolol.  BP appears improved,he is now tolerating low-dose metoprolol.  Issue however is his history of sinus pauses, as his loop recorder has shown prior pauses up to 8 seconds.  Have occurred during the day.  Given this, and now in atrial flutter with RVR, concern is for tachybrady syndrome and recommend EP evaluation.  Lonni LITTIE Nanas, MD

## 2024-04-01 NOTE — Care Management Obs Status (Signed)
 MEDICARE OBSERVATION STATUS NOTIFICATION   Patient Details  Name: Tan Clopper MRN: [id number] Date of Birth: [birth year]   Medicare Observation Status Notification Given:  Yes    Waddell Barnie Rama, RN 04/01/2024, 11:48 AM

## 2024-04-02 DIAGNOSIS — I4892 Unspecified atrial flutter: Secondary | ICD-10-CM | POA: Diagnosis not present

## 2024-04-02 LAB — CBC
HCT: 37.3 % — ABNORMAL LOW (ref 39.0–52.0)
Hemoglobin: 12 g/dL — ABNORMAL LOW (ref 13.0–17.0)
MCH: 28.2 pg (ref 26.0–34.0)
MCHC: 32.2 g/dL (ref 30.0–36.0)
MCV: 87.8 fL (ref 80.0–100.0)
Platelets: 193 K/uL (ref 150–400)
RBC: 4.25 MIL/uL (ref 4.22–5.81)
RDW: 15.4 % (ref 11.5–15.5)
WBC: 5 K/uL (ref 4.0–10.5)
nRBC: 0 % (ref 0.0–0.2)

## 2024-04-02 LAB — BASIC METABOLIC PANEL WITH GFR
Anion gap: 6 (ref 5–15)
BUN: 18 mg/dL (ref 8–23)
CO2: 27 mmol/L (ref 22–32)
Calcium: 9 mg/dL (ref 8.9–10.3)
Chloride: 106 mmol/L (ref 98–111)
Creatinine, Ser: 1.44 mg/dL — ABNORMAL HIGH (ref 0.61–1.24)
GFR, Estimated: 46 mL/min — ABNORMAL LOW (ref >60)
Glucose, Bld: 90 mg/dL (ref 70–99)
Potassium: 4.4 mmol/L (ref 3.5–5.1)
Sodium: 139 mmol/L (ref 135–145)

## 2024-04-02 LAB — MAGNESIUM: Magnesium: 1.8 mg/dL (ref 1.7–2.4)

## 2024-04-02 MED ORDER — MAGNESIUM SULFATE 2 GM/50ML IV SOLN
2.0000 g | Freq: Once | INTRAVENOUS | Status: AC
  Administered 2024-04-02: 2 g via INTRAVENOUS
  Filled 2024-04-02: qty 50

## 2024-04-02 NOTE — Progress Notes (Signed)
 PROGRESS NOTE    Shaun Brown  FMW:[id number] DOB: 10/14/1931 DOA: 03/30/2024 PCP: Shaun Charlie ORN, MD  89/M with history of CAD/CABG, aortic valve replacement, hypertension, dyslipidemia, remote A-fib after CABG, PAD, thoracic aortic aneurysm presented to the ED 7/16 night with new onset tachycardia and high blood pressure as noted by his Apple Watch..  Patient denies significant symptoms noticed some nausea few days ago, recently treated for pneumonia. - In the ED noted to be in atrial flutter with RVR, troponin 90, creatinine 1.4, hemoglobin 11, chest x-ray with no acute findings. - Admitted, started on Cardizem  gtt., then complicated by hypotension, given a fluid bolus, Cardizem  discontinued, cardiology consulted   Subjective: -Feels fair, no events overnight  Assessment and Plan:  Atrial flutter with RVR - 2D echo today - TSH is mildly elevated but free T4 is normal -Hypotensive after Cardizem   - Cardiology consult appreciated, plan for cardioversion on Monday, hold metoprolol  after a.m. dose tomorrow - Continue Eliquis   Systolic CHF - Echo noted EF down to 40-45%, mild MR, normal RV - Likely secondary to atrial flutter - Appears euvolemic, add Aldactone versus Jardiance at discharge  Hypomagnesemia Repleted  Hypertension - Amlodipine  on hold, blood pressure soft at this time  CAD/CABG PAD -Continue aspirin  and statin  CKD 3A Stable  History of thoracic aortic aneurysm History of enlarged aortic root History of aortic valve disease -Status post repair and aortic valve replacement with prosthesis  History of head neck cancer > Status post chemo, radiation - In remission   DVT prophylaxis: Eliquis  Code Status: Full code Family Communication: None present Disposition Plan: Home likely Monday     Objective: Vitals:   04/01/24 1549 04/01/24 1932 04/02/24 0401 04/02/24 0741  BP: 108/87 (!) 111/91 108/87 (!) 108/93  Pulse: (!) 118  (!) 118 (!) 120   Resp: 18 18 18 20   Temp: 97.6 F (36.4 C) 97.7 F (36.5 C) 97.8 F (36.6 C) (!) 97.3 F (36.3 C)  TempSrc: Oral Oral Oral Oral  SpO2: 97%   97%  Weight:      Height:        Intake/Output Summary (Last 24 hours) at 04/02/2024 1037 Last data filed at 04/02/2024 0931 Gross per 24 hour  Intake 237 ml  Output 1850 ml  Net -1613 ml   Filed Weights   03/31/24 1028 03/31/24 1517 04/01/24 0515  Weight: 84.4 kg 86.1 kg 87.1 kg    Examination:  General exam: Appears calm and comfortable  Respiratory system: Clear to auscultation Cardiovascular system: S1 & S2 heard, irregular Abd: nondistended, soft and nontender.Normal bowel sounds heard. Central nervous system: Alert and oriented. No focal neurological deficits. Extremities: no edema Skin: No rashes Psychiatry:  Mood & affect appropriate.     Data Reviewed:   CBC: Recent Labs  Lab 03/30/24 2217 04/01/24 0223 04/02/24 0829  WBC 5.2 4.4 5.0  HGB 11.4* 9.9* 12.0*  HCT 34.8* 30.5* 37.3*  MCV 87.2 86.4 87.8  PLT 208 181 193   Basic Metabolic Panel: Recent Labs  Lab 03/30/24 2217 03/31/24 0014 04/01/24 0223 04/02/24 0252  NA 137  --  138 139  K 4.0  --  4.2 4.4  CL 104  --  106 106  CO2 24  --  24 27  GLUCOSE 149*  --  100* 90  BUN 22  --  19 18  CREATININE 1.42*  --  1.28* 1.44*  CALCIUM  8.8*  --  8.4* 9.0  MG  --  1.6*  --  1.8   GFR: Estimated Creatinine Clearance: 38 mL/min (A) (by C-G formula based on SCr of 1.44 mg/dL (H)). Liver Function Tests: Recent Labs  Lab 04/01/24 0223  AST 18  ALT 20  ALKPHOS 40  BILITOT 0.5  PROT 6.1*  ALBUMIN 2.4*   No results for input(s): LIPASE, AMYLASE in the last 168 hours. No results for input(s): AMMONIA in the last 168 hours. Coagulation Profile: No results for input(s): INR, PROTIME in the last 168 hours. Cardiac Enzymes: No results for input(s): CKTOTAL, CKMB, CKMBINDEX, TROPONINI in the last 168 hours. BNP (last 3 results) No results  for input(s): PROBNP in the last 8760 hours. HbA1C: No results for input(s): HGBA1C in the last 72 hours. CBG: No results for input(s): GLUCAP in the last 168 hours. Lipid Profile: No results for input(s): CHOL, HDL, LDLCALC, TRIG, CHOLHDL, LDLDIRECT in the last 72 hours. Thyroid Function Tests: Recent Labs    03/31/24 0014 04/01/24 0223  TSH 6.896*  --   FREET4  --  0.75   Anemia Panel: No results for input(s): VITAMINB12, FOLATE, FERRITIN, TIBC, IRON, RETICCTPCT in the last 72 hours. Urine analysis: No results found for: COLORURINE, APPEARANCEUR, LABSPEC, PHURINE, GLUCOSEU, HGBUR, BILIRUBINUR, KETONESUR, PROTEINUR, UROBILINOGEN, NITRITE, LEUKOCYTESUR Sepsis Labs: @LABRCNTIP (procalcitonin:4,lacticidven:4)  )No results found for this or any previous visit (from the past 240 hours).   Radiology Studies: ECHOCARDIOGRAM COMPLETE Result Date: 04/01/2024    ECHOCARDIOGRAM REPORT   Patient Name:   Shaun Brown Date of Exam: 04/01/2024 Medical Rec #:  [id number]     Height:       69.0 in Accession #:    [id number]    Weight:       192.0 lb Date of Birth:  1931-11-04    BSA:          2.031 m Patient Age:    89 years      BP:           111/95 mmHg Patient Gender: M             HR:           116 bpm. Exam Location:  Inpatient Procedure: 2D Echo, Cardiac Doppler and Color Doppler (Both Spectral and Color            Flow Doppler were utilized during procedure). Indications:    Atrial Fibrillation I48.91  History:        Patient has no prior history of Echocardiogram examinations. CAD                 and Previous Myocardial Infarction; Arrythmias:Atrial                 Fibrillation and Atrial Flutter.  Sonographer:    Jayson Gaskins Referring Phys: [id number] Shaun Brown IMPRESSIONS  1. Left ventricular ejection fraction, by estimation, is 40 to 45%. The left ventricle has mildly decreased function. Left ventricular diastolic parameters are  indeterminate.  2. Right ventricular systolic function is normal. The right ventricular size is normal.  3. Left atrial size was severely dilated.  4. Mild mitral valve regurgitation.  5. The aortic valve is tricuspid. Aortic valve regurgitation is not visualized. Aortic valve sclerosis/calcification is present, without any evidence of aortic stenosis.  6. Aortic dilatation noted. Aneurysm of the ascending aorta, measuring 50 mm. FINDINGS  Left Ventricle: Left ventricular ejection fraction, by estimation, is 40 to 45%. The left ventricle has mildly decreased function. The left ventricular internal cavity size was normal in  size. There is no left ventricular hypertrophy. Left ventricular diastolic parameters are indeterminate. Right Ventricle: The right ventricular size is normal. Right vetricular wall thickness was not assessed. Right ventricular systolic function is normal. Left Atrium: Left atrial size was severely dilated. Right Atrium: Right atrial size was normal in size. Pericardium: There is no evidence of pericardial effusion. Mitral Valve: There is mild thickening of the mitral valve leaflet(s). There is mild calcification of the mitral valve leaflet(s). Mild to moderate mitral annular calcification. Mild mitral valve regurgitation. Tricuspid Valve: The tricuspid valve is normal in structure. Tricuspid valve regurgitation is trivial. Aortic Valve: The aortic valve is tricuspid. Aortic valve regurgitation is not visualized. Aortic valve sclerosis/calcification is present, without any evidence of aortic stenosis. Aortic valve mean gradient measures 4.0 mmHg. Aortic valve peak gradient measures 7.9 mmHg. Aortic valve area, by VTI measures 3.25 cm. Pulmonic Valve: The pulmonic valve was not well visualized. Pulmonic valve regurgitation is not visualized. No evidence of pulmonic stenosis. Aorta: The aortic root is normal in size and structure and aortic dilatation noted. There is an aneurysm involving the  ascending aorta measuring 50 mm. IAS/Shunts: No atrial level shunt detected by color flow Doppler.  LEFT VENTRICLE PLAX 2D LVIDd:         4.50 cm LVIDs:         3.00 cm LV PW:         1.00 cm LV IVS:        1.00 cm LVOT diam:     1.90 cm LV SV:         62 LV SV Index:   31 LVOT Area:     2.84 cm  RIGHT VENTRICLE RV S prime:     11.60 cm/s LEFT ATRIUM              Index        RIGHT ATRIUM           Index LA Vol (A2C):   59.6 ml  29.35 ml/m  RA Area:     17.80 cm LA Vol (A4C):   111.0 ml 54.66 ml/m  RA Volume:   46.10 ml  22.70 ml/m LA Biplane Vol: 85.0 ml  41.86 ml/m  AORTIC VALVE AV Area (Vmax):    2.58 cm AV Area (Vmean):   2.92 cm AV Area (VTI):     3.25 cm AV Vmax:           140.50 cm/s AV Vmean:          101.000 cm/s AV VTI:            0.192 m AV Peak Grad:      7.9 mmHg AV Mean Grad:      4.0 mmHg LVOT Vmax:         128.00 cm/s LVOT Vmean:        104.000 cm/s LVOT VTI:          0.220 m LVOT/AV VTI ratio: 1.15  AORTA Ao Root diam: 3.00 cm Ao Asc diam:  5.00 cm MITRAL VALVE MV Area (PHT): 5.88 cm    SHUNTS MV Decel Time: 129 msec    Systemic VTI:  0.22 m MV E velocity: 91.20 cm/s  Systemic Diam: 1.90 cm Vina Gull MD Electronically signed by Vina Gull MD Signature Date/Time: 04/01/2024/8:04:47 PM    Final      Scheduled Meds:  apixaban   5 mg Oral BID   aspirin  EC  81 mg Oral Daily  atorvastatin   40 mg Oral QHS   metoprolol  tartrate  25 mg Oral BID   [START ON 04/04/2024] metoprolol  tartrate  25 mg Oral BID   pantoprazole   40 mg Oral Daily   sodium chloride  flush  3 mL Intravenous Q12H   Continuous Infusions:  magnesium  sulfate bolus IVPB       LOS: 1 day    Time spent:    Sigurd Pac, MD [facility]   04/02/2024, 10:37 AM

## 2024-04-02 NOTE — Discharge Instructions (Signed)

## 2024-04-02 NOTE — Progress Notes (Signed)
   Rounding Note    Patient Name: Shaun Brown Date of Encounter: 04/02/2024  [facility] Cardiologist: Newman JINNY Lawrence, MD   Subjective   NAEO. Feels OK this AM. In chair.  Vital Signs    Vitals:   04/01/24 1549 04/01/24 1932 04/02/24 0401 04/02/24 0741  BP: 108/87 (!) 111/91 108/87 (!) 108/93  Pulse: (!) 118  (!) 118 (!) 120  Resp: 18 18 18 20   Temp: 97.6 F (36.4 C) 97.7 F (36.5 C) 97.8 F (36.6 C) (!) 97.3 F (36.3 C)  TempSrc: Oral Oral Oral Oral  SpO2: 97%   97%  Weight:      Height:        Intake/Output Summary (Last 24 hours) at 04/02/2024 0847 Last data filed at 04/02/2024 0731 Gross per 24 hour  Intake --  Output 1850 ml  Net -1850 ml      04/01/2024    5:15 AM 03/31/2024    3:17 PM 03/31/2024   10:28 AM  Last 3 Weights  Weight (lbs) 192 lb 0.3 oz 189 lb 13.1 oz 186 lb  Weight (kg) 87.1 kg 86.1 kg 84.369 kg      Telemetry    AFL w V rates ~ 120s - Personally Reviewed  ECG    Personally Reviewed  Physical Exam   GEN: No acute distress.   Cardiac: tachycardic. Regular rhythm., no murmurs, rubs, or gallops.  Respiratory: Clear to auscultation bilaterally. Psych: Normal affect   Assessment & Plan    #AFL Atypical appearing. V rates remain 120s Continue Eliquis . TEE/DCCV Monday. Hold metoprolol  12 hours prior to the DCCV     [employer] Cindie, MD, [facility], [facility] Cardiac Electrophysiology

## 2024-04-02 NOTE — Plan of Care (Signed)

## 2024-04-03 DIAGNOSIS — I4892 Unspecified atrial flutter: Secondary | ICD-10-CM | POA: Diagnosis not present

## 2024-04-03 LAB — BASIC METABOLIC PANEL WITH GFR
Anion gap: 8 (ref 5–15)
BUN: 20 mg/dL (ref 8–23)
CO2: 24 mmol/L (ref 22–32)
Calcium: 8.5 mg/dL — ABNORMAL LOW (ref 8.9–10.3)
Chloride: 105 mmol/L (ref 98–111)
Creatinine, Ser: 1.39 mg/dL — ABNORMAL HIGH (ref 0.61–1.24)
GFR, Estimated: 48 mL/min — ABNORMAL LOW (ref >60)
Glucose, Bld: 97 mg/dL (ref 70–99)
Potassium: 4.1 mmol/L (ref 3.5–5.1)
Sodium: 137 mmol/L (ref 135–145)

## 2024-04-03 MED ORDER — METOPROLOL TARTRATE 25 MG PO TABS
25.0000 mg | ORAL_TABLET | Freq: Two times a day (BID) | ORAL | Status: DC
  Administered 2024-04-04 – 2024-04-05 (×3): 25 mg via ORAL
  Filled 2024-04-03 (×3): qty 1

## 2024-04-03 NOTE — Significant Event (Signed)
   04/03/24 1518  Vitals  BP (!) 131/99  MAP (mmHg) 109  BP Location Right Arm  BP Method Automatic  Pulse Rate (!) 112  Pulse Rate Source Monitor   Patient c/o chest discomfort 3-4/5 characterized by a dull feeling above his heart area. See VS above. MD notified. Order for EKG.

## 2024-04-03 NOTE — Progress Notes (Signed)
 During rounds.SABRASABRAPatient shares he feels much better.

## 2024-04-03 NOTE — Progress Notes (Signed)
   Rounding Note    Patient Name: Shaun Brown Date of Encounter: 04/03/2024   [facility]: Newman JINNY Lawrence, MD   Subjective   NAEO. Feels OK this AM. In bed.   Vital Signs    Vitals:   04/02/24 2005 04/02/24 2356 04/03/24 0447 04/03/24 0735  BP: 110/77 (!) 120/93 (!) 118/91 (!) 123/100  Pulse: (!) 115 (!) 105 (!) 115   Resp: 20 19 18 20   Temp: 97.8 F (36.6 C) (!) 97.5 F (36.4 C) (!) 97.5 F (36.4 C) 97.6 F (36.4 C)  TempSrc: Oral Axillary Oral Oral  SpO2: 98% 98% 98% 98%  Weight:  90.7 kg    Height:        Intake/Output Summary (Last 24 hours) at 04/03/2024 1009 Last data filed at 04/03/2024 0800 Gross per 24 hour  Intake 360 ml  Output 1125 ml  Net -765 ml      04/02/2024   11:56 PM 04/01/2024    5:15 AM 03/31/2024    3:17 PM  Last 3 Weights  Weight (lbs) 199 lb 15.3 oz 192 lb 0.3 oz 189 lb 13.1 oz  Weight (kg) 90.7 kg 87.1 kg 86.1 kg      Telemetry    AFL w V rates now more variable, 60-110s - Personally Reviewed  ECG    Personally Reviewed  Physical Exam   GEN: No acute distress.   Cardiac: tachycardic. irregular rhythm, no murmurs, rubs, or gallops.  Respiratory: Clear to auscultation bilaterally. Psych: Normal affect   Assessment & Plan    #AFL Atypical appearing. V rates remain elevated Continue Eliquis . TEE/DCCV Monday. Hold metoprolol  12 hours prior to the DCCV     [employer] Cindie, MD, [facility], [facility] Cardiac Electrophysiology

## 2024-04-03 NOTE — Progress Notes (Signed)
 PROGRESS NOTE    Shaun Brown  FMW:[id number] DOB: 11/22/1931 DOA: 03/30/2024 PCP: Vernadine Charlie ORN, MD  89/M with history of CAD/CABG, aortic valve replacement, hypertension, dyslipidemia, remote A-fib after CABG, PAD, thoracic aortic aneurysm presented to the ED 7/16 night with new onset tachycardia and high blood pressure as noted by his Apple Watch..  Patient denies significant symptoms noticed some nausea few days ago, recently treated for pneumonia. - In the ED noted to be in atrial flutter with RVR, troponin 90, creatinine 1.4, hemoglobin 11, chest x-ray with no acute findings. - Admitted, started on Cardizem  gtt., then complicated by hypotension, given a fluid bolus, Cardizem  discontinued, cardiology consulted   Subjective: - Feels well, no events overnight  Assessment and Plan:  Atrial flutter with RVR - 2D echo today - TSH is mildly elevated but free T4 is normal -Hypotensive after Cardizem   - Cardiology consult appreciated, plan for cardioversion on Monday, hold metoprolol  after this morning's dose - Continue Eliquis   Acute systolic CHF - Echo noted EF down to 40-45%, mild MR, normal RV - Likely secondary to atrial flutter - Appears euvolemic, add Aldactone versus Jardiance at discharge - Needs repeat echo in 2 to 3 months after rhythm control  Hypomagnesemia Repleted  Hypertension - Amlodipine  on hold, blood pressure soft at this time  CAD/CABG PAD -Continue aspirin  and statin  CKD 3A Stable  History of thoracic aortic aneurysm History of enlarged aortic root History of aortic valve disease -Status post repair and aortic valve replacement with prosthesis  History of head neck cancer > Status post chemo, radiation - In remission   DVT prophylaxis: Eliquis  Code Status: Full code Family Communication: None present Disposition Plan: Home likely Monday post DCCV     Objective: Vitals:   04/02/24 2005 04/02/24 2356 04/03/24 0447 04/03/24 0735   BP: 110/77 (!) 120/93 (!) 118/91 (!) 123/100  Pulse: (!) 115 (!) 105 (!) 115   Resp: 20 19 18 20   Temp: 97.8 F (36.6 C) (!) 97.5 F (36.4 C) (!) 97.5 F (36.4 C) 97.6 F (36.4 C)  TempSrc: Oral Axillary Oral Oral  SpO2: 98% 98% 98% 98%  Weight:  90.7 kg    Height:        Intake/Output Summary (Last 24 hours) at 04/03/2024 1022 Last data filed at 04/03/2024 0800 Gross per 24 hour  Intake 360 ml  Output 1125 ml  Net -765 ml   Filed Weights   03/31/24 1517 04/01/24 0515 04/02/24 2356  Weight: 86.1 kg 87.1 kg 90.7 kg    Examination:  General exam: AO x 3, no distress HEENT: No JVD Respiratory system: Clear to auscultation Cardiovascular system:S1S2/Irregular Abd: nondistended, soft and nontender.Normal bowel sounds heard. Central nervous system: Alert and oriented. No focal neurological deficits. Extremities: no edema Skin: No rashes Psychiatry:  Mood & affect appropriate.     Data Reviewed:   CBC: Recent Labs  Lab 03/30/24 2217 04/01/24 0223 04/02/24 0829  WBC 5.2 4.4 5.0  HGB 11.4* 9.9* 12.0*  HCT 34.8* 30.5* 37.3*  MCV 87.2 86.4 87.8  PLT 208 181 193   Basic Metabolic Panel: Recent Labs  Lab 03/30/24 2217 03/31/24 0014 04/01/24 0223 04/02/24 0252 04/03/24 0338  NA 137  --  138 139 137  K 4.0  --  4.2 4.4 4.1  CL 104  --  106 106 105  CO2 24  --  24 27 24   GLUCOSE 149*  --  100* 90 97  BUN 22  --  19 18 20   CREATININE 1.42*  --  1.28* 1.44* 1.39*  CALCIUM  8.8*  --  8.4* 9.0 8.5*  MG  --  1.6*  --  1.8  --    GFR: Estimated Creatinine Clearance: 40.1 mL/min (A) (by C-G formula based on SCr of 1.39 mg/dL (H)). Liver Function Tests: Recent Labs  Lab 04/01/24 0223  AST 18  ALT 20  ALKPHOS 40  BILITOT 0.5  PROT 6.1*  ALBUMIN 2.4*   No results for input(s): LIPASE, AMYLASE in the last 168 hours. No results for input(s): AMMONIA in the last 168 hours. Coagulation Profile: No results for input(s): INR, PROTIME in the last 168  hours. Cardiac Enzymes: No results for input(s): CKTOTAL, CKMB, CKMBINDEX, TROPONINI in the last 168 hours. BNP (last 3 results) No results for input(s): PROBNP in the last 8760 hours. HbA1C: No results for input(s): HGBA1C in the last 72 hours. CBG: No results for input(s): GLUCAP in the last 168 hours. Lipid Profile: No results for input(s): CHOL, HDL, LDLCALC, TRIG, CHOLHDL, LDLDIRECT in the last 72 hours. Thyroid Function Tests: Recent Labs    04/01/24 0223  FREET4 0.75   Anemia Panel: No results for input(s): VITAMINB12, FOLATE, FERRITIN, TIBC, IRON, RETICCTPCT in the last 72 hours. Urine analysis: No results found for: COLORURINE, APPEARANCEUR, LABSPEC, PHURINE, GLUCOSEU, HGBUR, BILIRUBINUR, KETONESUR, PROTEINUR, UROBILINOGEN, NITRITE, LEUKOCYTESUR Sepsis Labs: @LABRCNTIP (procalcitonin:4,lacticidven:4)  )No results found for this or any previous visit (from the past 240 hours).   Radiology Studies: ECHOCARDIOGRAM COMPLETE Result Date: 04/01/2024    ECHOCARDIOGRAM REPORT   Patient Name:   Shaun Brown Date of Exam: 04/01/2024 Medical Rec #:  [id number]     Height:       69.0 in Accession #:    [id number]    Weight:       192.0 lb Date of Birth:  Jul 06, 1932    BSA:          2.031 m Patient Age:    89 years      BP:           111/95 mmHg Patient Gender: M             HR:           116 bpm. Exam Location:  Inpatient Procedure: 2D Echo, Cardiac Doppler and Color Doppler (Both Spectral and Color            Flow Doppler were utilized during procedure). Indications:    Atrial Fibrillation I48.91  History:        Patient has no prior history of Echocardiogram examinations. CAD                 and Previous Myocardial Infarction; Arrythmias:Atrial                 Fibrillation and Atrial Flutter.  Sonographer:    Jayson Gaskins Referring Phys: [id number] MARSA NOVAK MELVIN IMPRESSIONS  1. Left ventricular ejection fraction, by estimation,  is 40 to 45%. The left ventricle has mildly decreased function. Left ventricular diastolic parameters are indeterminate.  2. Right ventricular systolic function is normal. The right ventricular size is normal.  3. Left atrial size was severely dilated.  4. Mild mitral valve regurgitation.  5. The aortic valve is tricuspid. Aortic valve regurgitation is not visualized. Aortic valve sclerosis/calcification is present, without any evidence of aortic stenosis.  6. Aortic dilatation noted. Aneurysm of the ascending aorta, measuring 50 mm. FINDINGS  Left Ventricle: Left ventricular ejection fraction,  by estimation, is 40 to 45%. The left ventricle has mildly decreased function. The left ventricular internal cavity size was normal in size. There is no left ventricular hypertrophy. Left ventricular diastolic parameters are indeterminate. Right Ventricle: The right ventricular size is normal. Right vetricular wall thickness was not assessed. Right ventricular systolic function is normal. Left Atrium: Left atrial size was severely dilated. Right Atrium: Right atrial size was normal in size. Pericardium: There is no evidence of pericardial effusion. Mitral Valve: There is mild thickening of the mitral valve leaflet(s). There is mild calcification of the mitral valve leaflet(s). Mild to moderate mitral annular calcification. Mild mitral valve regurgitation. Tricuspid Valve: The tricuspid valve is normal in structure. Tricuspid valve regurgitation is trivial. Aortic Valve: The aortic valve is tricuspid. Aortic valve regurgitation is not visualized. Aortic valve sclerosis/calcification is present, without any evidence of aortic stenosis. Aortic valve mean gradient measures 4.0 mmHg. Aortic valve peak gradient measures 7.9 mmHg. Aortic valve area, by VTI measures 3.25 cm. Pulmonic Valve: The pulmonic valve was not well visualized. Pulmonic valve regurgitation is not visualized. No evidence of pulmonic stenosis. Aorta: The aortic  root is normal in size and structure and aortic dilatation noted. There is an aneurysm involving the ascending aorta measuring 50 mm. IAS/Shunts: No atrial level shunt detected by color flow Doppler.  LEFT VENTRICLE PLAX 2D LVIDd:         4.50 cm LVIDs:         3.00 cm LV PW:         1.00 cm LV IVS:        1.00 cm LVOT diam:     1.90 cm LV SV:         62 LV SV Index:   31 LVOT Area:     2.84 cm  RIGHT VENTRICLE RV S prime:     11.60 cm/s LEFT ATRIUM              Index        RIGHT ATRIUM           Index LA Vol (A2C):   59.6 ml  29.35 ml/m  RA Area:     17.80 cm LA Vol (A4C):   111.0 ml 54.66 ml/m  RA Volume:   46.10 ml  22.70 ml/m LA Biplane Vol: 85.0 ml  41.86 ml/m  AORTIC VALVE AV Area (Vmax):    2.58 cm AV Area (Vmean):   2.92 cm AV Area (VTI):     3.25 cm AV Vmax:           140.50 cm/s AV Vmean:          101.000 cm/s AV VTI:            0.192 m AV Peak Grad:      7.9 mmHg AV Mean Grad:      4.0 mmHg LVOT Vmax:         128.00 cm/s LVOT Vmean:        104.000 cm/s LVOT VTI:          0.220 m LVOT/AV VTI ratio: 1.15  AORTA Ao Root diam: 3.00 cm Ao Asc diam:  5.00 cm MITRAL VALVE MV Area (PHT): 5.88 cm    SHUNTS MV Decel Time: 129 msec    Systemic VTI:  0.22 m MV E velocity: 91.20 cm/s  Systemic Diam: 1.90 cm Vina Gull MD Electronically signed by Vina Gull MD Signature Date/Time: 04/01/2024/8:04:47 PM    Final  Scheduled Meds:  apixaban   5 mg Oral BID   aspirin  EC  81 mg Oral Daily   atorvastatin   40 mg Oral QHS   [START ON 04/04/2024] metoprolol  tartrate  25 mg Oral BID   pantoprazole   40 mg Oral Daily   sodium chloride  flush  3 mL Intravenous Q12H   Continuous Infusions:     LOS: 2 days    Time spent:    Sigurd Pac, MD [facility]   04/03/2024, 10:22 AM

## 2024-04-04 ENCOUNTER — Encounter (HOSPITAL_COMMUNITY)
Admission: EM | Disposition: A | Discharge status: Home / Self Care | Payer: Self-pay | Source: Home / Self Care | Attending: Internal Medicine

## 2024-04-04 ENCOUNTER — Inpatient Hospital Stay (HOSPITAL_COMMUNITY): Admitting: Anesthesiology

## 2024-04-04 ENCOUNTER — Inpatient Hospital Stay (HOSPITAL_COMMUNITY)

## 2024-04-04 ENCOUNTER — Encounter (HOSPITAL_COMMUNITY): Payer: Self-pay | Admitting: Cardiovascular Disease

## 2024-04-04 DIAGNOSIS — I2510 Atherosclerotic heart disease of native coronary artery without angina pectoris: Secondary | ICD-10-CM

## 2024-04-04 DIAGNOSIS — I129 Hypertensive chronic kidney disease with stage 1 through stage 4 chronic kidney disease, or unspecified chronic kidney disease: Secondary | ICD-10-CM

## 2024-04-04 DIAGNOSIS — I340 Nonrheumatic mitral (valve) insufficiency: Secondary | ICD-10-CM

## 2024-04-04 DIAGNOSIS — I361 Nonrheumatic tricuspid (valve) insufficiency: Secondary | ICD-10-CM

## 2024-04-04 DIAGNOSIS — N1831 Chronic kidney disease, stage 3a: Secondary | ICD-10-CM

## 2024-04-04 DIAGNOSIS — I4891 Unspecified atrial fibrillation: Secondary | ICD-10-CM

## 2024-04-04 DIAGNOSIS — I4892 Unspecified atrial flutter: Secondary | ICD-10-CM | POA: Diagnosis not present

## 2024-04-04 HISTORY — PX: TRANSESOPHAGEAL ECHOCARDIOGRAM (CATH LAB): EP1270

## 2024-04-04 HISTORY — PX: CARDIOVERSION: EP1203

## 2024-04-04 LAB — BASIC METABOLIC PANEL WITH GFR
Anion gap: 9 (ref 5–15)
BUN: 20 mg/dL (ref 8–23)
CO2: 24 mmol/L (ref 22–32)
Calcium: 8.4 mg/dL — ABNORMAL LOW (ref 8.9–10.3)
Chloride: 105 mmol/L (ref 98–111)
Creatinine, Ser: 1.21 mg/dL (ref 0.61–1.24)
GFR, Estimated: 57 mL/min — ABNORMAL LOW (ref >60)
Glucose, Bld: 98 mg/dL (ref 70–99)
Potassium: 4.1 mmol/L (ref 3.5–5.1)
Sodium: 138 mmol/L (ref 135–145)

## 2024-04-04 LAB — ECHO TEE

## 2024-04-04 LAB — MAGNESIUM: Magnesium: 1.9 mg/dL (ref 1.7–2.4)

## 2024-04-04 SURGERY — TRANSESOPHAGEAL ECHOCARDIOGRAM (TEE) (CATHLAB)
Anesthesia: Monitor Anesthesia Care

## 2024-04-04 MED ORDER — PHENYLEPHRINE 80 MCG/ML (10ML) SYRINGE FOR IV PUSH (FOR BLOOD PRESSURE SUPPORT)
PREFILLED_SYRINGE | INTRAVENOUS | Status: DC | PRN
  Administered 2024-04-04: 80 ug via INTRAVENOUS
  Administered 2024-04-04 (×3): 160 ug via INTRAVENOUS

## 2024-04-04 MED ORDER — LIDOCAINE 2% (20 MG/ML) 5 ML SYRINGE
INTRAMUSCULAR | Status: DC | PRN
  Administered 2024-04-04: 60 mg via INTRAVENOUS

## 2024-04-04 MED ORDER — SODIUM CHLORIDE 0.9 % IV SOLN: INTRAVENOUS | Status: DC

## 2024-04-04 MED ORDER — PROPOFOL 10 MG/ML IV BOLUS
INTRAVENOUS | Status: DC | PRN
  Administered 2024-04-04: 20 mg via INTRAVENOUS
  Administered 2024-04-04: 40 mg via INTRAVENOUS

## 2024-04-04 MED ORDER — PROPOFOL 500 MG/50ML IV EMUL
INTRAVENOUS | Status: DC | PRN
  Administered 2024-04-04: 150 ug/kg/min via INTRAVENOUS

## 2024-04-04 MED ORDER — EPHEDRINE SULFATE (PRESSORS) 50 MG/ML IJ SOLN
INTRAMUSCULAR | Status: DC | PRN
Start: 2024-04-04 — End: 2024-04-04
  Administered 2024-04-04 (×2): 5 mg via INTRAVENOUS

## 2024-04-04 SURGICAL SUPPLY — 1 items: PAD DEFIB RADIO PHYSIO CONN (PAD) ×1 IMPLANT

## 2024-04-04 NOTE — Progress Notes (Signed)
   Rounding Note    Patient Name: Shaun Brown Date of Encounter: 04/04/2024  [facility] Cardiologist: Newman JINNY Lawrence, MD   Subjective   NAEO. Feels OK. In bed.   Vital Signs    Vitals:   04/03/24 1518 04/03/24 1521 04/03/24 2015 04/04/24 0349  BP: (!) 131/99  (!) 115/92 100/80  Pulse: (!) 112     Resp: 20   16  Temp: 97.6 F (36.4 C) 97.6 F (36.4 C) 98.1 F (36.7 C) 97.6 F (36.4 C)  TempSrc: Oral Oral Oral Oral  SpO2: 99%  98%   Weight:    87.3 kg  Height:        Intake/Output Summary (Last 24 hours) at 04/04/2024 0625 Last data filed at 04/03/2024 1754 Gross per 24 hour  Intake 960 ml  Output --  Net 960 ml      04/04/2024    3:49 AM 04/02/2024   11:56 PM 04/01/2024    5:15 AM  Last 3 Weights  Weight (lbs) 192 lb 7.4 oz 199 lb 15.3 oz 192 lb 0.3 oz  Weight (kg) 87.3 kg 90.7 kg 87.1 kg      Telemetry    AFL w variable V rates - Personally Reviewed  ECG    Personally Reviewed  Physical Exam   GEN: No acute distress.   Cardiac: tachycardic. irregular rhythm, no murmurs, rubs, or gallops.  Respiratory: Clear to auscultation bilaterally. Psych: Normal affect   Assessment & Plan    #AFL Atypical appearing. V rates remain elevated Continue Eliquis . TEE/DCCV Monday. Procedure including the risks reviewed and he wishes to proceed.    Keep NPO.    Ole T. Cindie, MD, [facility], [facility] Cardiac Electrophysiology

## 2024-04-04 NOTE — Progress Notes (Signed)
   04/04/24 0725  Vitals  Temp 97.6 F (36.4 C)  Temp Source Oral  BP 120/85  MAP (mmHg) 92  BP Location Right Arm  BP Method Automatic  Patient Position (if appropriate) Lying  Pulse Rate (!) 118  Pulse Rate Source Monitor  ECG Heart Rate (!) 119  Resp 20  MEWS COLOR  MEWS Score Color Yellow  Oxygen Therapy  SpO2 97 %  O2 Device Room Air  MEWS Score  MEWS Temp 0  MEWS Systolic 0  MEWS Pulse 2  MEWS RR 0  MEWS LOC 0  MEWS Score 2   Patient is a chronic yellow MEWS due to A Fib. Patient is scheduled to have TEE today.

## 2024-04-04 NOTE — H&P (View-Only) (Signed)
   Rounding Note    Patient Name: Shaun Brown Date of Encounter: 04/04/2024  [facility] Cardiologist: Newman JINNY Lawrence, MD   Subjective   NAEO. Feels OK. In bed.   Vital Signs    Vitals:   04/03/24 1518 04/03/24 1521 04/03/24 2015 04/04/24 0349  BP: (!) 131/99  (!) 115/92 100/80  Pulse: (!) 112     Resp: 20   16  Temp: 97.6 F (36.4 C) 97.6 F (36.4 C) 98.1 F (36.7 C) 97.6 F (36.4 C)  TempSrc: Oral Oral Oral Oral  SpO2: 99%  98%   Weight:    87.3 kg  Height:        Intake/Output Summary (Last 24 hours) at 04/04/2024 0625 Last data filed at 04/03/2024 1754 Gross per 24 hour  Intake 960 ml  Output --  Net 960 ml      04/04/2024    3:49 AM 04/02/2024   11:56 PM 04/01/2024    5:15 AM  Last 3 Weights  Weight (lbs) 192 lb 7.4 oz 199 lb 15.3 oz 192 lb 0.3 oz  Weight (kg) 87.3 kg 90.7 kg 87.1 kg      Telemetry    AFL w variable V rates - Personally Reviewed  ECG    Personally Reviewed  Physical Exam   GEN: No acute distress.   Cardiac: tachycardic. irregular rhythm, no murmurs, rubs, or gallops.  Respiratory: Clear to auscultation bilaterally. Psych: Normal affect   Assessment & Plan    #AFL Atypical appearing. V rates remain elevated Continue Eliquis . TEE/DCCV Monday. Procedure including the risks reviewed and he wishes to proceed.    Keep NPO.    Ole T. Cindie, MD, [facility], [facility] Cardiac Electrophysiology

## 2024-04-04 NOTE — Transfer of Care (Signed)
 Immediate Anesthesia Transfer of Care Note  Patient: Shaun Brown  Procedure(s) Performed: TRANSESOPHAGEAL ECHOCARDIOGRAM CARDIOVERSION  Patient Location: Cath Lab  Anesthesia Type:MAC  Level of Consciousness: drowsy  Airway & Oxygen Therapy: Patient Spontanous Breathing and Patient connected to nasal cannula oxygen  Post-op Assessment: Report given to RN and Post -op Vital signs reviewed and stable  Post vital signs: Reviewed and stable  Last Vitals:  Vitals Value Taken Time  BP    Temp    Pulse    Resp    SpO2      Last Pain:  Vitals:   04/04/24 0846  TempSrc:   PainSc: 0-No pain      Patients Stated Pain Goal: 3 (04/03/24 1531)  Complications: No notable events documented.

## 2024-04-04 NOTE — Progress Notes (Signed)
 PROGRESS NOTE    Shaun Brown  FMW:[id number] DOB: Jun 19, 1932 DOA: 03/30/2024 PCP: Vernadine Charlie ORN, MD  89/M with history of CAD/CABG, aortic valve replacement, hypertension, dyslipidemia, remote A-fib after CABG, PAD, thoracic aortic aneurysm presented to the ED 7/16 night with new onset tachycardia and high blood pressure as noted by his Apple Watch..  Patient denies significant symptoms noticed some nausea few days ago, recently treated for pneumonia. - In the ED noted to be in atrial flutter with RVR, troponin 90, creatinine 1.4, hemoglobin 11, chest x-ray with no acute findings. - Admitted, started on Cardizem  gtt., then complicated by hypotension, given a fluid bolus, Cardizem  discontinued, cardiology consulted   Subjective: - Feels well, no events overnight, getting wheeled out for cardioversion  Assessment and Plan:  Atrial flutter with RVR - 2D echo today - TSH is mildly elevated but free T4 is normal -Hypotensive after Cardizem   - Cardiology consult appreciated, plan for cardioversion this morning, restart metoprolol  this afternoon - Continue Eliquis   Acute systolic CHF - Echo noted EF down to 40-45%, mild MR, normal RV - Likely secondary to atrial flutter - Appears euvolemic, add Aldactone at discharge - Needs repeat echo in 2 to 3 months after rhythm control  Hypomagnesemia Repleted  Hypertension - Amlodipine  on hold, blood pressure soft at this time  CAD/CABG PAD -Continue aspirin  and statin  CKD 3A Stable  History of thoracic aortic aneurysm History of enlarged aortic root History of aortic valve disease -Status post repair and aortic valve replacement with prosthesis  History of head neck cancer > Status post chemo, radiation - In remission   DVT prophylaxis: Eliquis  Code Status: Full code Family Communication: None present Disposition Plan: Home likely tomorrow     Objective: Vitals:   04/04/24 1010 04/04/24 1015 04/04/24 1026 04/04/24  1100  BP: 102/79  94/71 111/80  Pulse: 73 (!) 57 73 71  Resp: 15 13 16    Temp:   (!) 97.3 F (36.3 C)   TempSrc:   Oral   SpO2: 95% 95% 97% 99%  Weight:      Height:        Intake/Output Summary (Last 24 hours) at 04/04/2024 1205 Last data filed at 04/04/2024 0940 Gross per 24 hour  Intake 750 ml  Output 850 ml  Net -100 ml   Filed Weights   04/01/24 0515 04/02/24 2356 04/04/24 0349  Weight: 87.1 kg 90.7 kg 87.3 kg    Examination:  General exam: AO x 3, no distress HEENT: No JVD Respiratory system: Clear to auscultation Cardiovascular system:S1S2/Irregular Abd: nondistended, soft and nontender.Normal bowel sounds heard. Central nervous system: Alert and oriented. No focal neurological deficits. Extremities: no edema Skin: No rashes Psychiatry:  Mood & affect appropriate.     Data Reviewed:   CBC: Recent Labs  Lab 03/30/24 2217 04/01/24 0223 04/02/24 0829  WBC 5.2 4.4 5.0  HGB 11.4* 9.9* 12.0*  HCT 34.8* 30.5* 37.3*  MCV 87.2 86.4 87.8  PLT 208 181 193   Basic Metabolic Panel: Recent Labs  Lab 03/30/24 2217 03/31/24 0014 04/01/24 0223 04/02/24 0252 04/03/24 0338 04/04/24 0333  NA 137  --  138 139 137 138  K 4.0  --  4.2 4.4 4.1 4.1  CL 104  --  106 106 105 105  CO2 24  --  24 27 24 24   GLUCOSE 149*  --  100* 90 97 98  BUN 22  --  19 18 20 20   CREATININE 1.42*  --  1.28* 1.44* 1.39* 1.21  CALCIUM  8.8*  --  8.4* 9.0 8.5* 8.4*  MG  --  1.6*  --  1.8  --  1.9   GFR: Estimated Creatinine Clearance: 45.3 mL/min (by C-G formula based on SCr of 1.21 mg/dL). Liver Function Tests: Recent Labs  Lab 04/01/24 0223  AST 18  ALT 20  ALKPHOS 40  BILITOT 0.5  PROT 6.1*  ALBUMIN 2.4*   No results for input(s): LIPASE, AMYLASE in the last 168 hours. No results for input(s): AMMONIA in the last 168 hours. Coagulation Profile: No results for input(s): INR, PROTIME in the last 168 hours. Cardiac Enzymes: No results for input(s): CKTOTAL,  CKMB, CKMBINDEX, TROPONINI in the last 168 hours. BNP (last 3 results) No results for input(s): PROBNP in the last 8760 hours. HbA1C: No results for input(s): HGBA1C in the last 72 hours. CBG: No results for input(s): GLUCAP in the last 168 hours. Lipid Profile: No results for input(s): CHOL, HDL, LDLCALC, TRIG, CHOLHDL, LDLDIRECT in the last 72 hours. Thyroid Function Tests: No results for input(s): TSH, T4TOTAL, FREET4, T3FREE, THYROIDAB in the last 72 hours.  Anemia Panel: No results for input(s): VITAMINB12, FOLATE, FERRITIN, TIBC, IRON, RETICCTPCT in the last 72 hours. Urine analysis: No results found for: COLORURINE, APPEARANCEUR, LABSPEC, PHURINE, GLUCOSEU, HGBUR, BILIRUBINUR, KETONESUR, PROTEINUR, UROBILINOGEN, NITRITE, LEUKOCYTESUR Sepsis Labs: @LABRCNTIP (procalcitonin:4,lacticidven:4)  )No results found for this or any previous visit (from the past 240 hours).   Radiology Studies: ECHO TEE Result Date: 04/04/2024    TRANSESOPHOGEAL ECHO REPORT   Patient Name:   Shaun Brown Date of Exam: 04/04/2024 Medical Rec #:  [id number]     Height:       69.0 in Accession #:    [id number]    Weight:       192.5 lb Date of Birth:  1932/02/15    BSA:          2.033 m Patient Age:    89 years      BP:           131/105 mmHg Patient Gender: M             HR:           100 bpm. Exam Location:  Inpatient Procedure: Transesophageal Echo, Color Doppler and Cardiac Doppler (Both            Spectral and Color Flow Doppler were utilized during procedure). Indications:    Cardioversion  History:        Patient has prior history of Echocardiogram examinations, most                 recent 04/01/2024.  Sonographer:    Tinnie Gosling RDCS Referring Phys: [phone] MAUDE JAYSON EMMER PROCEDURE: After discussion of the risks and benefits of a TEE, an informed consent was obtained from the patient. The transesophogeal probe was passed without difficulty  through the esophogus of the patient. Sedation performed by different physician. The patient developed no complications during the procedure.  IMPRESSIONS  1. Left ventricular ejection fraction, by estimation, is 40 to 45%. The left ventricle has mildly decreased function. The left ventricle demonstrates global hypokinesis. The left ventricular internal cavity size was mildly dilated.  2. Right ventricular systolic function is normal. The right ventricular size is normal.  3. Left atrial size was severely dilated. No left atrial/left atrial appendage thrombus was detected.  4. Right atrial size was mildly dilated.  5. The mitral valve is abnormal. Mild mitral  valve regurgitation.  6. The aortic valve is tricuspid. There is moderate calcification of the aortic valve. There is moderate thickening of the aortic valve. Aortic valve regurgitation is not visualized. Aortic valve sclerosis is present, with no evidence of aortic valve stenosis.  7. Aortic dilatation noted. There is severe dilatation of the ascending aorta, measuring 50 mm.  8. Not done and demonstrates None. FINDINGS  Left Ventricle: Left ventricular ejection fraction, by estimation, is 40 to 45%. The left ventricle has mildly decreased function. The left ventricle demonstrates global hypokinesis. The left ventricular internal cavity size was mildly dilated. Right Ventricle: The right ventricular size is normal. Right vetricular wall thickness was not assessed. Right ventricular systolic function is normal. Left Atrium: Left atrial size was severely dilated. No left atrial/left atrial appendage thrombus was detected. Right Atrium: Right atrial size was mildly dilated. Pericardium: There is no evidence of pericardial effusion. Mitral Valve: The mitral valve is abnormal. There is mild thickening of the mitral valve leaflet(s). Mild mitral valve regurgitation. Tricuspid Valve: The tricuspid valve is normal in structure. Tricuspid valve regurgitation is mild.  Aortic Valve: The aortic valve is tricuspid. There is moderate calcification of the aortic valve. There is moderate thickening of the aortic valve. Aortic valve regurgitation is not visualized. Aortic valve sclerosis is present, with no evidence of aortic valve stenosis. Pulmonic Valve: The pulmonic valve was normal in structure. Pulmonic valve regurgitation is trivial. Aorta: Aortic dilatation noted. There is severe dilatation of the ascending aorta, measuring 50 mm. IAS/Shunts: No atrial level shunt detected by color flow Doppler. Additional Comments: 3D was performed not requiring image post processing on an independent workstation and was indeterminate.  AORTA Ao Root diam: 4.60 cm Ao Asc diam:  5.00 cm [name] MD Electronically signed by [name] MD Signature Date/Time: 04/04/2024/9:50:31 AM    Final    EP STUDY Result Date: 04/04/2024 See surgical note for result.    Scheduled Meds:  apixaban   5 mg Oral BID   aspirin  EC  81 mg Oral Daily   atorvastatin   40 mg Oral QHS   metoprolol  tartrate  25 mg Oral BID   pantoprazole   40 mg Oral Daily   sodium chloride  flush  3 mL Intravenous Q12H   Continuous Infusions:     LOS: 3 days    Time spent:    Sigurd Pac, MD [facility]   04/04/2024, 12:05 PM

## 2024-04-04 NOTE — CV Procedure (Signed)
 TEE/DCC: Anesthesia Propofol  On eliquis  since 04/01/24  EF 40-45% global hypokinesis Mild MR AV sclerosis Severe ascending thoracic aorta dilatation 5.0 cm with sinus 4.5 cm No LAA thrombus in orthogonal planes Normal RV No effusion No ASD/PFO  DCC x 1 250 J biphasic  Converted from flutter rate 118 bpm to NSR rate 54 bpm  No immediate neurologic sequelae  [name] MD [facility]

## 2024-04-04 NOTE — Anesthesia Preprocedure Evaluation (Signed)
 Anesthesia Evaluation  Patient identified by MRN, date of birth, ID band Patient awake    Reviewed: Allergy & Precautions, H&P , NPO status , Patient's Chart, lab work & pertinent test results  Airway Mallampati: II   Neck ROM: full    Dental   Pulmonary former smoker   breath sounds clear to auscultation       Cardiovascular hypertension, + CAD, + CABG and + Peripheral Vascular Disease  + dysrhythmias Atrial Fibrillation + Valvular Problems/Murmurs  Rhythm:irregular Rate:Normal  S/p AVR   Neuro/Psych    GI/Hepatic ,GERD  ,,  Endo/Other    Renal/GU Renal disease     Musculoskeletal   Abdominal   Peds  Hematology   Anesthesia Other Findings   Reproductive/Obstetrics                              Anesthesia Physical Anesthesia Plan  ASA: 3  Anesthesia Plan: MAC   Post-op Pain Management:    Induction: Intravenous  PONV Risk Score and Plan: 1 and Propofol  infusion and Treatment may vary due to age or medical condition  Airway Management Planned: Nasal Cannula  Additional Equipment:   Intra-op Plan:   Post-operative Plan:   Informed Consent: I have reviewed the patients History and Physical, chart, labs and discussed the procedure including the risks, benefits and alternatives for the proposed anesthesia with the patient or authorized representative who has indicated his/her understanding and acceptance.     Dental advisory given  Plan Discussed with: CRNA, Anesthesiologist and Surgeon  Anesthesia Plan Comments:         Anesthesia Quick Evaluation

## 2024-04-04 NOTE — Interval H&P Note (Signed)
 History and Physical Interval Note:  04/04/2024 9:42 AM  Shaun Brown  has presented today for surgery, with the diagnosis of afib.  The various methods of treatment have been discussed with the patient and family. After consideration of risks, benefits and other options for treatment, the patient has consented to  Procedure(s): TRANSESOPHAGEAL ECHOCARDIOGRAM (N/A) CARDIOVERSION (N/A) as a surgical intervention.  The patient's history has been reviewed, patient examined, no change in status, stable for surgery.  I have reviewed the patient's chart and labs.  Questions were answered to the patient's satisfaction.     [name]

## 2024-04-05 ENCOUNTER — Other Ambulatory Visit (HOSPITAL_COMMUNITY): Payer: Self-pay

## 2024-04-05 DIAGNOSIS — I4892 Unspecified atrial flutter: Secondary | ICD-10-CM | POA: Diagnosis not present

## 2024-04-05 LAB — BASIC METABOLIC PANEL WITH GFR
Anion gap: 9 (ref 5–15)
BUN: 22 mg/dL (ref 8–23)
CO2: 21 mmol/L — ABNORMAL LOW (ref 22–32)
Calcium: 8.4 mg/dL — ABNORMAL LOW (ref 8.9–10.3)
Chloride: 105 mmol/L (ref 98–111)
Creatinine, Ser: 1.31 mg/dL — ABNORMAL HIGH (ref 0.61–1.24)
GFR, Estimated: 52 mL/min — ABNORMAL LOW (ref >60)
Glucose, Bld: 105 mg/dL — ABNORMAL HIGH (ref 70–99)
Potassium: 4 mmol/L (ref 3.5–5.1)
Sodium: 135 mmol/L (ref 135–145)

## 2024-04-05 MED ORDER — APIXABAN 5 MG PO TABS
5.0000 mg | ORAL_TABLET | Freq: Two times a day (BID) | ORAL | 1 refills | Status: AC
  Filled 2024-04-05: qty 60, 30d supply, fill #0

## 2024-04-05 MED ORDER — LOSARTAN POTASSIUM 25 MG PO TABS
25.0000 mg | ORAL_TABLET | Freq: Every day | ORAL | 1 refills | Status: DC
  Filled 2024-04-05: qty 30, 30d supply, fill #0

## 2024-04-05 MED ORDER — METOPROLOL SUCCINATE ER 25 MG PO TB24
25.0000 mg | ORAL_TABLET | Freq: Every day | ORAL | 11 refills | Status: DC
  Filled 2024-04-05: qty 30, 30d supply, fill #0

## 2024-04-05 NOTE — Anesthesia Postprocedure Evaluation (Signed)
 Anesthesia Post Note  Patient: Shaun Brown  Procedure(s) Performed: TRANSESOPHAGEAL ECHOCARDIOGRAM CARDIOVERSION     Patient location during evaluation: Cath Lab Anesthesia Type: MAC Level of consciousness: awake and alert Pain management: pain level controlled Vital Signs Assessment: post-procedure vital signs reviewed and stable Respiratory status: spontaneous breathing, nonlabored ventilation, respiratory function stable and patient connected to nasal cannula oxygen Cardiovascular status: stable and blood pressure returned to baseline Postop Assessment: no apparent nausea or vomiting Anesthetic complications: no   No notable events documented.  Last Vitals:  Vitals:   04/05/24 1115 04/05/24 1125  BP: (!) 88/59 91/60  Pulse: (!) 47 (!) 47  Resp: 17 17  Temp: 36.4 C 36.4 C  SpO2: 97% 99%    Last Pain:  Vitals:   04/05/24 1125  TempSrc: Oral  PainSc:                  Micael Barb S

## 2024-04-05 NOTE — Progress Notes (Signed)
 Heart Failure Navigator Progress Note  Assessed for Heart & Vascular TOC clinic readiness.  Patient does not meet criteria due to will follow up with [facility], has a A-fib clinic appointment on 04/18/2024. No HF TOC per Dr. Fairy. .   Navigator will sign off at this time.   Stephane Haddock, BSN, Scientist, clinical (histocompatibility and immunogenetics) Only

## 2024-04-05 NOTE — Progress Notes (Signed)
 TOC meds picked up and given to his RN

## 2024-04-05 NOTE — Discharge Summary (Signed)
 Physician Discharge Summary  Shaun Brown FMW:[id number] DOB: 1932-04-19 DOA: 03/30/2024  PCP: Vernadine Charlie ORN, MD  Admit date: 03/30/2024 Discharge date: 04/05/2024  Time spent:45 minutes  Recommendations for Outpatient Follow-up:  Cardiology, A-fib clinic on 8/4 PCP in 1 week, please check BMP at follow-up Monitor for fluid retention, add GDMT if needed Repeat echo in 2 to 3 months   Discharge Diagnoses:  Principal Problem:   Atrial flutter with rapid ventricular response (HCC) Cardiomyopathy   S/P AVR (aortic valve replacement) and aortoplasty   Coronary artery disease involving native coronary artery of native heart without angina pectoris   Left carotid bruit   Asymptomatic stenosis of left carotid artery   Primary hypertension   Thoracic aortic aneurysm without rupture (HCC)   Atherosclerosis of artery of both lower extremities (HCC)   Chronic kidney disease, stage 3a (HCC)   Gastro-esophageal reflux disease without esophagitis   Presence of prosthetic heart valve   Pure hypercholesterolemia   Tachycardia-bradycardia syndrome (HCC)   Discharge Condition: Improved  Diet recommendation: Low-salt AM, heart healthy  Filed Weights   04/02/24 2356 04/04/24 0349 04/05/24 0355  Weight: 90.7 kg 87.3 kg 88.2 kg    History of present illness:  89/M with history of CAD/CABG, aortic valve replacement, hypertension, dyslipidemia, remote A-fib after CABG, PAD, thoracic aortic aneurysm presented to the ED 7/16 night with new onset tachycardia and high blood pressure as noted by his Apple Watch..  Patient denies significant symptoms noticed some nausea few days ago, recently treated for pneumonia. - In the ED noted to be in atrial flutter with RVR, troponin 90, creatinine 1.4, hemoglobin 11, chest x-ray with no acute findings. - Admitted, started on Cardizem  gtt., then complicated by hypotension, given a fluid bolus, Cardizem  discontinued, cardiology consulted  Hospital Course:   Atrial flutter with RVR - TSH is mildly elevated but free T4 is normal -Hypotensive after Cardizem   - Cardiology consulted, started on metoprolol , cardioverted yesterday - changed to metoprolol  and continue Eliquis  -Follow-up in A-fib clinic on 8/4   Acute systolic CHF - Echo noted EF down to 40-45%, mild MR, normal RV - Likely secondary to atrial flutter - Remains euvolemic throughout this admission, did not require any diuretics -Discussed with cards this morning, change amlodipine  to losartan  -Discussed with patient regarding salt restriction, diet and lifestyle modification, calling cards clinic if he notices any swelling or worsening dyspnea - Needs repeat echo in 2 to 3 months after rhythm control   Hypomagnesemia Repleted   Hypertension - Amlodipine  on hold, blood pressure soft at this time   CAD/CABG PAD -Continue aspirin  and statin   CKD 3A Stable   History of thoracic aortic aneurysm History of enlarged aortic root History of aortic valve disease -Status post repair and aortic valve replacement with prosthesis   History of head neck cancer > Status post chemo, radiation - In remission  Discharge Exam: Vitals:   04/05/24 0355 04/05/24 0743  BP: 105/71 115/77  Pulse: (!) 49 (!) 53  Resp: 18 16  Temp: 97.6 F (36.4 C) (!) 97.5 F (36.4 C)  SpO2: 96% 95%   Gen: Awake, Alert, Oriented X 3,  HEENT: no JVD Lungs: Good air movement bilaterally, CTAB CVS: S1S2/RRR Abd: soft, Non tender, non distended, BS present Extremities: No edema Skin: no new rashes on exposed skin   Discharge Instructions   Discharge Instructions     Amb referral to AFIB Clinic   Complete by: As directed    Diet - low  sodium heart healthy   Complete by: As directed    Increase activity slowly   Complete by: As directed       Allergies as of 04/05/2024       Reactions   Beef-derived Drug Products Other (See Comments)   Only fish for a meat. Kosher friendly   Chicken  Allergy Other (See Comments)   Only fish for a meat. Kosher friendly   Pork-derived Products Other (See Comments)   Only fish for a meat. Kosher friendly.   Tetanus Toxoids Other (See Comments)   Unknown reaction to live vaccine   Amoxil [amoxicillin] Rash        Medication List     STOP taking these medications    amLODipine  5 MG tablet Commonly known as: NORVASC        TAKE these medications    acetaminophen  500 MG tablet Commonly known as: TYLENOL  Take 1,000 mg by mouth 2 (two) times daily as needed for fever, headache or moderate pain (pain score 4-6).   apixaban  5 MG Tabs tablet Commonly known as: ELIQUIS  Take 1 tablet (5 mg total) by mouth 2 (two) times daily.   aspirin  EC 81 MG tablet Take 81 mg by mouth daily.   atorvastatin  40 MG tablet Commonly known as: LIPITOR Take 40 mg by mouth at bedtime.   b complex vitamins tablet Take 1 tablet by mouth daily.   benzonatate 200 MG capsule Commonly known as: TESSALON Take 200 mg by mouth 3 (three) times daily as needed for cough.   CALCIUM  + VITAMIN D3 PO Take 1 tablet by mouth daily.   DentaGel 1.1 % Gel dental gel Generic drug: sodium fluoride Place 1 Application onto teeth at bedtime.   LASTACAFT OP Place 1 drop into both eyes daily as needed (eye irritation).   losartan  25 MG tablet Commonly known as: Cozaar  Take 1 tablet (25 mg total) by mouth daily.   metoprolol  succinate 25 MG 24 hr tablet Commonly known as: Toprol  XL Take 1 tablet (25 mg total) by mouth daily.   nitroGLYCERIN  0.4 MG SL tablet Commonly known as: NITROSTAT  Place 1 tablet (0.4 mg total) under the tongue every 5 (five) minutes as needed for chest pain.   omeprazole 20 MG capsule Commonly known as: PRILOSEC Take 20 mg by mouth daily.   VITAMIN D-3 PO Take 1 capsule by mouth daily.       Allergies  Allergen Reactions   Beef-Derived Drug Products Other (See Comments)    Only fish for a meat. Kosher friendly   Chicken  Allergy Other (See Comments)    Only fish for a meat. Kosher friendly    Pork-Derived Products Other (See Comments)    Only fish for a meat. Kosher friendly.    Tetanus Toxoids Other (See Comments)    Unknown reaction to live vaccine   Amoxil [Amoxicillin] Rash      The results of significant diagnostics from this hospitalization (including imaging, microbiology, ancillary and laboratory) are listed below for reference.    Significant Diagnostic Studies: ECHO TEE Result Date: 04/04/2024    TRANSESOPHOGEAL ECHO REPORT   Patient Name:   MATTY VANROEKEL Date of Exam: 04/04/2024 Medical Rec #:  [id number]     Height:       69.0 in Accession #:    [id number]    Weight:       192.5 lb Date of Birth:  Mar 06, 1932    BSA:  2.033 m Patient Age:    89 years      BP:           131/105 mmHg Patient Gender: M             HR:           100 bpm. Exam Location:  Inpatient Procedure: Transesophageal Echo, Color Doppler and Cardiac Doppler (Both            Spectral and Color Flow Doppler were utilized during procedure). Indications:    Cardioversion  History:        Patient has prior history of Echocardiogram examinations, most                 recent 04/01/2024.  Sonographer:    Tinnie Gosling RDCS Referring Phys: [phone] MAUDE JAYSON EMMER PROCEDURE: After discussion of the risks and benefits of a TEE, an informed consent was obtained from the patient. The transesophogeal probe was passed without difficulty through the esophogus of the patient. Sedation performed by different physician. The patient developed no complications during the procedure.  IMPRESSIONS  1. Left ventricular ejection fraction, by estimation, is 40 to 45%. The left ventricle has mildly decreased function. The left ventricle demonstrates global hypokinesis. The left ventricular internal cavity size was mildly dilated.  2. Right ventricular systolic function is normal. The right ventricular size is normal.  3. Left atrial size was severely dilated. No  left atrial/left atrial appendage thrombus was detected.  4. Right atrial size was mildly dilated.  5. The mitral valve is abnormal. Mild mitral valve regurgitation.  6. The aortic valve is tricuspid. There is moderate calcification of the aortic valve. There is moderate thickening of the aortic valve. Aortic valve regurgitation is not visualized. Aortic valve sclerosis is present, with no evidence of aortic valve stenosis.  7. Aortic dilatation noted. There is severe dilatation of the ascending aorta, measuring 50 mm.  8. Not done and demonstrates None. FINDINGS  Left Ventricle: Left ventricular ejection fraction, by estimation, is 40 to 45%. The left ventricle has mildly decreased function. The left ventricle demonstrates global hypokinesis. The left ventricular internal cavity size was mildly dilated. Right Ventricle: The right ventricular size is normal. Right vetricular wall thickness was not assessed. Right ventricular systolic function is normal. Left Atrium: Left atrial size was severely dilated. No left atrial/left atrial appendage thrombus was detected. Right Atrium: Right atrial size was mildly dilated. Pericardium: There is no evidence of pericardial effusion. Mitral Valve: The mitral valve is abnormal. There is mild thickening of the mitral valve leaflet(s). Mild mitral valve regurgitation. Tricuspid Valve: The tricuspid valve is normal in structure. Tricuspid valve regurgitation is mild. Aortic Valve: The aortic valve is tricuspid. There is moderate calcification of the aortic valve. There is moderate thickening of the aortic valve. Aortic valve regurgitation is not visualized. Aortic valve sclerosis is present, with no evidence of aortic valve stenosis. Pulmonic Valve: The pulmonic valve was normal in structure. Pulmonic valve regurgitation is trivial. Aorta: Aortic dilatation noted. There is severe dilatation of the ascending aorta, measuring 50 mm. IAS/Shunts: No atrial level shunt detected by color  flow Doppler. Additional Comments: 3D was performed not requiring image post processing on an independent workstation and was indeterminate.  AORTA Ao Root diam: 4.60 cm Ao Asc diam:  5.00 cm [name] MD Electronically signed by [name] MD Signature Date/Time: 04/04/2024/9:50:31 AM    Final    EP STUDY Result Date: 04/04/2024 See surgical note for  result.  ECHOCARDIOGRAM COMPLETE Result Date: 04/01/2024    ECHOCARDIOGRAM REPORT   Patient Name:   JAELYN CLONINGER Date of Exam: 04/01/2024 Medical Rec #:  [id number]     Height:       69.0 in Accession #:    [id number]    Weight:       192.0 lb Date of Birth:  06/27/1932    BSA:          2.031 m Patient Age:    89 years      BP:           111/95 mmHg Patient Gender: M             HR:           116 bpm. Exam Location:  Inpatient Procedure: 2D Echo, Cardiac Doppler and Color Doppler (Both Spectral and Color            Flow Doppler were utilized during procedure). Indications:    Atrial Fibrillation I48.91  History:        Patient has no prior history of Echocardiogram examinations. CAD                 and Previous Myocardial Infarction; Arrythmias:Atrial                 Fibrillation and Atrial Flutter.  Sonographer:    Jayson Gaskins Referring Phys: [id number] MARSA NOVAK MELVIN IMPRESSIONS  1. Left ventricular ejection fraction, by estimation, is 40 to 45%. The left ventricle has mildly decreased function. Left ventricular diastolic parameters are indeterminate.  2. Right ventricular systolic function is normal. The right ventricular size is normal.  3. Left atrial size was severely dilated.  4. Mild mitral valve regurgitation.  5. The aortic valve is tricuspid. Aortic valve regurgitation is not visualized. Aortic valve sclerosis/calcification is present, without any evidence of aortic stenosis.  6. Aortic dilatation noted. Aneurysm of the ascending aorta, measuring 50 mm. FINDINGS  Left Ventricle: Left ventricular ejection fraction, by estimation, is 40 to 45%. The  left ventricle has mildly decreased function. The left ventricular internal cavity size was normal in size. There is no left ventricular hypertrophy. Left ventricular diastolic parameters are indeterminate. Right Ventricle: The right ventricular size is normal. Right vetricular wall thickness was not assessed. Right ventricular systolic function is normal. Left Atrium: Left atrial size was severely dilated. Right Atrium: Right atrial size was normal in size. Pericardium: There is no evidence of pericardial effusion. Mitral Valve: There is mild thickening of the mitral valve leaflet(s). There is mild calcification of the mitral valve leaflet(s). Mild to moderate mitral annular calcification. Mild mitral valve regurgitation. Tricuspid Valve: The tricuspid valve is normal in structure. Tricuspid valve regurgitation is trivial. Aortic Valve: The aortic valve is tricuspid. Aortic valve regurgitation is not visualized. Aortic valve sclerosis/calcification is present, without any evidence of aortic stenosis. Aortic valve mean gradient measures 4.0 mmHg. Aortic valve peak gradient measures 7.9 mmHg. Aortic valve area, by VTI measures 3.25 cm. Pulmonic Valve: The pulmonic valve was not well visualized. Pulmonic valve regurgitation is not visualized. No evidence of pulmonic stenosis. Aorta: The aortic root is normal in size and structure and aortic dilatation noted. There is an aneurysm involving the ascending aorta measuring 50 mm. IAS/Shunts: No atrial level shunt detected by color flow Doppler.  LEFT VENTRICLE PLAX 2D LVIDd:         4.50 cm LVIDs:         3.00 cm LV PW:  1.00 cm LV IVS:        1.00 cm LVOT diam:     1.90 cm LV SV:         62 LV SV Index:   31 LVOT Area:     2.84 cm  RIGHT VENTRICLE RV S prime:     11.60 cm/s LEFT ATRIUM              Index        RIGHT ATRIUM           Index LA Vol (A2C):   59.6 ml  29.35 ml/m  RA Area:     17.80 cm LA Vol (A4C):   111.0 ml 54.66 ml/m  RA Volume:   46.10 ml   22.70 ml/m LA Biplane Vol: 85.0 ml  41.86 ml/m  AORTIC VALVE AV Area (Vmax):    2.58 cm AV Area (Vmean):   2.92 cm AV Area (VTI):     3.25 cm AV Vmax:           140.50 cm/s AV Vmean:          101.000 cm/s AV VTI:            0.192 m AV Peak Grad:      7.9 mmHg AV Mean Grad:      4.0 mmHg LVOT Vmax:         128.00 cm/s LVOT Vmean:        104.000 cm/s LVOT VTI:          0.220 m LVOT/AV VTI ratio: 1.15  AORTA Ao Root diam: 3.00 cm Ao Asc diam:  5.00 cm MITRAL VALVE MV Area (PHT): 5.88 cm    SHUNTS MV Decel Time: 129 msec    Systemic VTI:  0.22 m MV E velocity: 91.20 cm/s  Systemic Diam: 1.90 cm Vina Gull MD Electronically signed by Vina Gull MD Signature Date/Time: 04/01/2024/8:04:47 PM    Final    DG Chest 2 View Result Date: 03/30/2024 CLINICAL DATA:  Chest pain and tachycardia EXAM: CHEST - 2 VIEW COMPARISON:  CT of the chest 12/14/2020 FINDINGS: Cardiac silhouette is enlarged. Sternotomy wires are present. Loop recorder device again seen in the anterior left chest. Prosthetic heart valve present. There is no focal lung infiltrate, pleural effusion or pneumothorax. No acute fractures are seen. IMPRESSION: 1. No active cardiopulmonary disease. 2. Cardiomegaly. Electronically Signed   By: Greig Pique M.D.   On: 03/30/2024 22:42   CUP PACEART REMOTE DEVICE CHECK Result Date: 03/28/2024 ILR summary report received. Battery status OK. Normal device function. No new symptom, tachy, brady, or pause episodes. No new AF episodes. Monthly summary reports and ROV/PRN. MC, CVRS   Microbiology: No results found for this or any previous visit (from the past 240 hours).   Labs: Basic Metabolic Panel: Recent Labs  Lab 03/31/24 0014 04/01/24 0223 04/02/24 0252 04/03/24 0338 04/04/24 0333 04/05/24 0218  NA  --  138 139 137 138 135  K  --  4.2 4.4 4.1 4.1 4.0  CL  --  106 106 105 105 105  CO2  --  24 27 24 24  21*  GLUCOSE  --  100* 90 97 98 105*  BUN  --  19 18 20 20 22   CREATININE  --  1.28* 1.44*  1.39* 1.21 1.31*  CALCIUM   --  8.4* 9.0 8.5* 8.4* 8.4*  MG 1.6*  --  1.8  --  1.9  --    Liver Function  Tests: Recent Labs  Lab 04/01/24 0223  AST 18  ALT 20  ALKPHOS 40  BILITOT 0.5  PROT 6.1*  ALBUMIN 2.4*   No results for input(s): LIPASE, AMYLASE in the last 168 hours. No results for input(s): AMMONIA in the last 168 hours. CBC: Recent Labs  Lab 03/30/24 2217 04/01/24 0223 04/02/24 0829  WBC 5.2 4.4 5.0  HGB 11.4* 9.9* 12.0*  HCT 34.8* 30.5* 37.3*  MCV 87.2 86.4 87.8  PLT 208 181 193   Cardiac Enzymes: No results for input(s): CKTOTAL, CKMB, CKMBINDEX, TROPONINI in the last 168 hours. BNP: BNP (last 3 results) No results for input(s): BNP in the last 8760 hours.  ProBNP (last 3 results) No results for input(s): PROBNP in the last 8760 hours.  CBG: No results for input(s): GLUCAP in the last 168 hours.     Signed:  Sigurd Pac MD.  [facility] 04/05/2024, 10:21 AM

## 2024-04-05 NOTE — Plan of Care (Signed)

## 2024-04-05 NOTE — Progress Notes (Signed)
  Patient Name: Shaun Brown Date of Encounter: 04/05/2024  Primary Cardiologist: Shaun JINNY Lawrence, MD Electrophysiologist: None  Interval Summary   Feeling good this am. Excited to go home.   Vital Signs    Vitals:   04/04/24 1950 04/04/24 2340 04/05/24 0355 04/05/24 0743  BP: 97/66 106/70 105/71   Pulse: (!) 53 (!) 45 (!) 49 (!) 53  Resp: 18 17 18 16   Temp: 98 F (36.7 C) 97.6 F (36.4 C) 97.6 F (36.4 C) (!) 97.5 F (36.4 C)  TempSrc: Oral Oral Oral Oral  SpO2: 99% 99% 96% 95%  Weight:   88.2 kg   Height:        Intake/Output Summary (Last 24 hours) at 04/05/2024 0843 Last data filed at 04/05/2024 0404 Gross per 24 hour  Intake 590 ml  Output 575 ml  Net 15 ml   Filed Weights   04/02/24 2356 04/04/24 0349 04/05/24 0355  Weight: 90.7 kg 87.3 kg 88.2 kg    Physical Exam    GEN- NAD, Alert and oriented  Lungs- Clear to ausculation bilaterally, normal work of breathing Cardiac- Regular rate and rhythm, no murmurs, rubs or gallops GI- soft, NT, ND, + BS Extremities- no clubbing or cyanosis. No edema  Telemetry    Sinus brady / NSR 40-60s (personally reviewed)  Hospital Course    Shaun Brown is a 89 y.o. male with a history of AF, AAS s/p bioprosthetic AVR, CAD s/p DES (2006), PAD, thoracic aneurysm s/p repair in 2017, head and neck cancer s/p chemo/XRT with residual difficulties swallowing at time admitted for elevated HR and AFL after recent PNA.   Assessment & Plan    Atrial flutter Continue eliquis  5 mg BID for CHA2DS2/VASc of at least 4 S/p TEE/DCC 04/04/2024 and maintaining NSR Will discuss with Dr. Cindie if need to consider AAD and stop Lopressor  given baseline bradycardia vs watchful waiting in setting of recent PNA.     From EP perspective, potentially home today after MD sees.   For questions or updates, please contact [facility] Please consult [email or web address] for contact info under     Signed, Ozell Prentice Passey, PA-C   04/05/2024, 8:43 AM

## 2024-04-05 NOTE — TOC Transition Note (Signed)
 Transition of Care [facility]) - Discharge Note   Patient Details  Name: Shaun Brown MRN: [id number] Date of Birth: 1931/09/16  Transition of Care [facility] CM/SW Contact:  Waddell Barnie Rama, RN Phone Number: 04/05/2024, 10:34 AM   Clinical Narrative:    For dc today, wife at bedside to transport him home. NCM informed him about eliquis  co pay of 30.00 for refills.          Patient Goals and CMS Choice            Discharge Placement                       Discharge Plan and Services Additional resources added to the After Visit Summary for                                       Social Drivers of Health (SDOH) Interventions SDOH Screenings   Food Insecurity: No Food Insecurity (03/31/2024)  Housing: Low Risk  (03/31/2024)  Transportation Needs: No Transportation Needs (03/31/2024)  Utilities: Not At Risk (03/31/2024)  Social Connections: Socially Integrated (03/31/2024)  Tobacco Use: Medium Risk (03/30/2024)     Readmission Risk Interventions     No data to display

## 2024-04-11 ENCOUNTER — Other Ambulatory Visit (HOSPITAL_COMMUNITY): Payer: Self-pay | Admitting: *Deleted

## 2024-04-11 ENCOUNTER — Telehealth (HOSPITAL_COMMUNITY): Payer: Self-pay | Admitting: *Deleted

## 2024-04-11 NOTE — Telephone Encounter (Signed)
 Patients wife called and reported elevated HR 113 possible Afib. Pt has not missed any meds. Plan is to continue to monitor and follow up with appt in our clinic. ER precautions reviewed if needed.

## 2024-04-12 ENCOUNTER — Ambulatory Visit (HOSPITAL_COMMUNITY)
Admission: RE | Admit: 2024-04-12 | Discharge: 2024-04-12 | Disposition: A | Discharge status: Home / Self Care | Source: Ambulatory Visit | Attending: Internal Medicine | Admitting: Internal Medicine

## 2024-04-12 VITALS — BP 114/88 | HR 121 | Ht 69.0 in | Wt 192.0 lb

## 2024-04-12 DIAGNOSIS — Z951 Presence of aortocoronary bypass graft: Secondary | ICD-10-CM | POA: Insufficient documentation

## 2024-04-12 DIAGNOSIS — I739 Peripheral vascular disease, unspecified: Secondary | ICD-10-CM | POA: Diagnosis not present

## 2024-04-12 DIAGNOSIS — I4892 Unspecified atrial flutter: Secondary | ICD-10-CM | POA: Diagnosis not present

## 2024-04-12 DIAGNOSIS — Z8589 Personal history of malignant neoplasm of other organs and systems: Secondary | ICD-10-CM | POA: Diagnosis not present

## 2024-04-12 DIAGNOSIS — I2510 Atherosclerotic heart disease of native coronary artery without angina pectoris: Secondary | ICD-10-CM | POA: Insufficient documentation

## 2024-04-12 DIAGNOSIS — I4891 Unspecified atrial fibrillation: Secondary | ICD-10-CM | POA: Diagnosis not present

## 2024-04-12 DIAGNOSIS — E785 Hyperlipidemia, unspecified: Secondary | ICD-10-CM | POA: Insufficient documentation

## 2024-04-12 DIAGNOSIS — I480 Paroxysmal atrial fibrillation: Secondary | ICD-10-CM | POA: Diagnosis present

## 2024-04-12 DIAGNOSIS — Z7901 Long term (current) use of anticoagulants: Secondary | ICD-10-CM | POA: Insufficient documentation

## 2024-04-12 DIAGNOSIS — D6869 Other thrombophilia: Secondary | ICD-10-CM | POA: Insufficient documentation

## 2024-04-12 MED ORDER — AMIODARONE HCL 200 MG PO TABS: ORAL_TABLET | ORAL | 2 refills | Status: DC

## 2024-04-12 NOTE — Addendum Note (Signed)
 Encounter addended by: Janel Nancy SAUNDERS, RN on: 04/12/2024 3:46 PM  Actions taken: Pharmacy for encounter modified, Order list changed

## 2024-04-12 NOTE — Patient Instructions (Signed)
 Start amiodarone  200 mg twice a day for 30 days then decrease to 200 mg once a day

## 2024-04-12 NOTE — Progress Notes (Signed)
 Primary Care Physician: Tisovec, Charlie ORN, MD Primary Cardiologist: Newman JINNY Lawrence, MD Electrophysiologist: None     Referring Physician: Fairy Frames, MD     Shaun Brown is a 89 y.o. male with a history of CAD s/p CABG, head/neck cancer in remission, AVR, HTN, dyslipidemia, PAD, thoracic aortic aneurysm, and atrial fibrillation/flutter who presents for consultation in the [facility].  Hospital admission 7/16-22/2025 for atrial flutter with RVR possibly secondary to pneumonia diagnosis prior to admission. S/p DCCV on 04/04/24. Patient contacted office on 7/28 noting likely ERAF. Patient is on Eliquis  5 mg BID for a CHADS2VASC score of 4.  On evaluation today, patient is currently in atrial flutter with RVR. He has not missed any doses of Eliquis . He feels tired when out of rhythm.  Today, he denies symptoms of chest pain, orthopnea, PND, lower extremity edema, dizziness, presyncope, syncope, snoring, daytime somnolence, bleeding, or neurologic sequela. The patient is tolerating medications without difficulties and is otherwise without complaint today.    he has a BMI of Body mass index is 28.35 kg/m.SABRA Filed Weights   04/12/24 1431  Weight: 87.1 kg    Current Outpatient Medications  Medication Sig Dispense Refill   acetaminophen  (TYLENOL ) 500 MG tablet Take 1,000 mg by mouth 2 (two) times daily as needed for fever, headache or moderate pain (pain score 4-6). (Patient taking differently: Take 1,000 mg by mouth as needed for fever, headache or moderate pain (pain score 4-6).)     Alcaftadine (LASTACAFT OP) Place 1 drop into both eyes daily as needed (eye irritation).     apixaban  (ELIQUIS ) 5 MG TABS tablet Take 1 tablet (5 mg total) by mouth 2 (two) times daily. 60 tablet 1   aspirin  EC 81 MG tablet Take 81 mg by mouth daily.     atorvastatin  (LIPITOR) 40 MG tablet Take 40 mg by mouth at bedtime.     b complex vitamins tablet Take 1 tablet by  mouth daily.     benzonatate (TESSALON) 200 MG capsule Take 200 mg by mouth 3 (three) times daily as needed for cough.     Calcium  Carb-Cholecalciferol (CALCIUM  + VITAMIN D3 PO) Take 1 tablet by mouth daily.     Cholecalciferol (VITAMIN D-3 PO) Take 1 capsule by mouth daily.     losartan  (COZAAR ) 25 MG tablet 1 tablet Orally Once a day     metoprolol  succinate (TOPROL  XL) 25 MG 24 hr tablet Take 1 tablet (25 mg total) by mouth daily. 30 tablet 11   nitroGLYCERIN  (NITROSTAT ) 0.4 MG SL tablet Place 1 tablet (0.4 mg total) under the tongue every 5 (five) minutes as needed for chest pain. 25 tablet 3   omeprazole (PRILOSEC) 20 MG capsule Take 20 mg by mouth daily.     sodium fluoride (DENTAGEL) 1.1 % GEL dental gel Place 1 Application onto teeth at bedtime.     No current facility-administered medications for this encounter.    Atrial Fibrillation Management history:  Previous antiarrhythmic drugs: none Previous cardioversions: 04/04/24 Previous ablations: none Anticoagulation history: Eliquis    ROS- All systems are reviewed and negative except as per the HPI above.  Physical Exam: BP 114/88   Pulse (!) 121   Ht 5' 9 (1.753 m)   Wt 87.1 kg   BMI 28.35 kg/m   GEN: Well nourished, well developed in no acute distress NECK: No JVD; No carotid bruits CARDIAC: Regular tachycardic rate, no murmurs, rubs, gallops RESPIRATORY:  Clear to auscultation  without rales, wheezing or rhonchi  ABDOMEN: Soft, non-tender, non-distended EXTREMITIES:  No edema; No deformity   EKG today demonstrates  Vent. rate 121 BPM PR interval 200 ms QRS duration 108 ms QT/QTcB 320/454 ms P-R-T axes * 161 -30 Sinus tachycardia Right axis deviation Incomplete right bundle branch block Septal infarct , age undetermined ST & T wave abnormality, consider inferior ischemia Abnormal ECG When compared with ECG of 04-Apr-2024 10:02, Premature ventricular complexes are no longer Present Vent. rate has increased  BY 50 BPM Incomplete right bundle branch block has replaced Incomplete left bundle branch block  Echo 04/01/24 demonstrated   1. Left ventricular ejection fraction, by estimation, is 40 to 45%. The  left ventricle has mildly decreased function. Left ventricular diastolic  parameters are indeterminate.   2. Right ventricular systolic function is normal. The right ventricular  size is normal.   3. Left atrial size was severely dilated.   4. Mild mitral valve regurgitation.   5. The aortic valve is tricuspid. Aortic valve regurgitation is not  visualized. Aortic valve sclerosis/calcification is present, without any  evidence of aortic stenosis.   6. Aortic dilatation noted. Aneurysm of the ascending aorta, measuring 50  mm.   ASSESSMENT & PLAN CHA2DS2-VASc Score = 4  The patient's score is based upon: CHF History: 0 HTN History: 1 Diabetes History: 0 Stroke History: 0 Vascular Disease History: 1 Age Score: 2 Gender Score: 0       ASSESSMENT AND PLAN: Paroxysmal Atrial Flutter (ICD10:  I48.0) The patient's CHA2DS2-VASc score is 4, indicating a 4.8% annual risk of stroke.    He is currently in atrial flutter with RVR. He has had ERAF since successful DCCV on 7/21. We had a discussion about rhythm control therapy. We discussed AAD therapy to help try to maintain normal rhythm. We discussed amiodarone  and its potential adverse effects as well as long term monitoring. After discussion, patient is interested in beginning amiodarone . Will begin amiodarone  load of 200 mg BID x 4 weeks then transition to once daily. Advised patient if still out of rhythm then will likely consider repeat DCCV. Patient is going out of town for a family member's wedding so will schedule follow up for as soon as they return.  Secondary Hypercoagulable State (ICD10:  D68.69) The patient is at significant risk for stroke/thromboembolism based upon his CHA2DS2-VASc Score of 4.  Continue Apixaban  (Eliquis ).  No  missed doses.    Follow up 3-4 weeks.    Terra Pac, PA-C  [facility] [address] [place], [place] [address] [phone]

## 2024-04-15 ENCOUNTER — Telehealth (HOSPITAL_COMMUNITY): Payer: Self-pay

## 2024-04-15 NOTE — Progress Notes (Signed)
 [facility] Summary Report / Loop Recorder

## 2024-04-15 NOTE — Addendum Note (Signed)
 Addended by: VICCI SELLER A on: 04/15/2024 09:47 AM   Modules accepted: Orders

## 2024-04-15 NOTE — Telephone Encounter (Signed)
 Patient wife states that the patient blood pressure 80/64 at 2:30. Already took metoprolol  and losartan  for today. Reached out to provide he stated Hold losartan , drink plenty of fluids to help get his BP back up, and call back on Monday with BP update.

## 2024-04-18 ENCOUNTER — Ambulatory Visit (HOSPITAL_COMMUNITY): Admitting: Internal Medicine

## 2024-04-18 ENCOUNTER — Other Ambulatory Visit: Payer: Self-pay

## 2024-04-18 ENCOUNTER — Emergency Department (HOSPITAL_BASED_OUTPATIENT_CLINIC_OR_DEPARTMENT_OTHER)
Admission: EM | Admit: 2024-04-18 | Discharge: 2024-04-18 | Disposition: A | Discharge status: Home / Self Care | Attending: Emergency Medicine | Admitting: Emergency Medicine

## 2024-04-18 DIAGNOSIS — S61412A Laceration without foreign body of left hand, initial encounter: Secondary | ICD-10-CM | POA: Diagnosis not present

## 2024-04-18 DIAGNOSIS — Z7982 Long term (current) use of aspirin: Secondary | ICD-10-CM | POA: Insufficient documentation

## 2024-04-18 DIAGNOSIS — S61419A Laceration without foreign body of unspecified hand, initial encounter: Secondary | ICD-10-CM

## 2024-04-18 DIAGNOSIS — S60922A Unspecified superficial injury of left hand, initial encounter: Secondary | ICD-10-CM | POA: Diagnosis present

## 2024-04-18 DIAGNOSIS — W108XXA Fall (on) (from) other stairs and steps, initial encounter: Secondary | ICD-10-CM | POA: Insufficient documentation

## 2024-04-18 DIAGNOSIS — Z7901 Long term (current) use of anticoagulants: Secondary | ICD-10-CM | POA: Diagnosis not present

## 2024-04-18 NOTE — ED Provider Notes (Signed)
 [place] EMERGENCY DEPARTMENT AT [facility] Provider Note   CSN: [id number] Arrival date & time: 04/18/24  1510     Patient presents with: Fall and Abrasion   Shaun Brown is a 89 y.o. male.   89 yo M with a chief complaint of a fall.  Patient said he was coming down the attic stairs and lost his balance and fell onto his left hand.  Complaining of a wound to the dorsum of the left hand.  He denies head injury loss consciousness neck pain back pain chest pain abdominal pain.  Does not think it is broken.  Feels like his tetanus is up-to-date.  Family is concerned because he has a history of atrial fibrillation and is on Eliquis .   Fall       Prior to Admission medications   Medication Sig Start Date End Date Taking? Authorizing Provider  acetaminophen  (TYLENOL ) 500 MG tablet Take 1,000 mg by mouth 2 (two) times daily as needed for fever, headache or moderate pain (pain score 4-6). Patient taking differently: Take 1,000 mg by mouth as needed for fever, headache or moderate pain (pain score 4-6).    [provider]  Alcaftadine (LASTACAFT OP) Place 1 drop into both eyes daily as needed (eye irritation).    [provider]  amiodarone  (PACERONE ) 200 MG tablet Take 1 tablet (200 mg total) by mouth 2 (two) times daily for 30 days, THEN 1 tablet (200 mg total) daily. 04/12/24 05/12/25  Terra Fairy PARAS, PA-C  apixaban  (ELIQUIS ) 5 MG TABS tablet Take 1 tablet (5 mg total) by mouth 2 (two) times daily. 04/05/24   Fairy Frames, MD  aspirin  EC 81 MG tablet Take 81 mg by mouth daily.    [provider]  atorvastatin  (LIPITOR) 40 MG tablet Take 40 mg by mouth at bedtime. 11/04/18   [provider]  b complex vitamins tablet Take 1 tablet by mouth daily.    [provider]  benzonatate (TESSALON) 200 MG capsule Take 200 mg by mouth 3 (three) times daily as needed for cough. 03/23/24   [provider]  Calcium  Carb-Cholecalciferol  (CALCIUM  + VITAMIN D3 PO) Take 1 tablet by mouth daily.    [provider]  Cholecalciferol (VITAMIN D-3 PO) Take 1 capsule by mouth daily.    [provider]  losartan  (COZAAR ) 25 MG tablet 1 tablet Orally Once a day 04/06/24   [provider]  metoprolol  succinate (TOPROL  XL) 25 MG 24 hr tablet Take 1 tablet (25 mg total) by mouth daily. 04/05/24 04/05/25  Fairy Frames, MD  nitroGLYCERIN  (NITROSTAT ) 0.4 MG SL tablet Place 1 tablet (0.4 mg total) under the tongue every 5 (five) minutes as needed for chest pain. 09/18/20 05/21/24  Patwardhan, Newman PARAS, MD  omeprazole (PRILOSEC) 20 MG capsule Take 20 mg by mouth daily. 11/22/18   [provider]  sodium fluoride (DENTAGEL) 1.1 % GEL dental gel Place 1 Application onto teeth at bedtime.    [provider]    Allergies: Beef-derived drug products, Chicken allergy, Pork-derived products, Tetanus toxoids, and Amoxil [amoxicillin]    Review of Systems  Updated Vital Signs BP (!) 133/101 (BP Location: Right Arm)   Pulse (!) 114   Temp 98.1 F (36.7 C) (Oral)   Resp 16   SpO2 98%   Physical Exam Vitals and nursing note reviewed.  Constitutional:      Appearance: He is well-developed.  HENT:     Head: Normocephalic and atraumatic.  Eyes:     Pupils: Pupils are equal, round, and reactive to light.  Neck:     Vascular: No JVD.  Cardiovascular:     Rate and Rhythm: Normal rate and regular rhythm.     Heart sounds: No murmur heard.    No friction rub. No gallop.  Pulmonary:     Effort: No respiratory distress.     Breath sounds: No wheezing.  Abdominal:     General: There is no distension.     Tenderness: There is no abdominal tenderness. There is no guarding or rebound.  Musculoskeletal:        General: Normal range of motion.     Cervical back: Normal range of motion and neck supple.     Comments: 2 skin tears to the dorsum left hand about the 2nd and 3rd MCP.  No obvious bony tenderness.   Skin:    Coloration: Skin is not pale.     Findings: No rash.  Neurological:     Mental Status: He is alert and oriented to person, place, and time.  Psychiatric:        Behavior: Behavior normal.     (all labs ordered are listed, but only abnormal results are displayed) Labs Reviewed - No data to display  EKG: None  Radiology: No results found.   Procedures   Medications Ordered in the ED - No data to display                                  Medical Decision Making  89 yo M with chief complaints of pain to the dorsum of his left hand after a fall.  Nonsyncopal by history.  Complaining mostly of a skin tear.  Family is concerned that it would not stop bleeding with his history of A-fib and being on Eliquis .  Bleeding has been controlled here.  I did offer imaging which the patient has declined.  Wounds thoroughly irrigated.  Steri-Strips.  PCP follow-up.  3:41 PM:  I have discussed the diagnosis/risks/treatment options with the patient and family.  Evaluation and diagnostic testing in the emergency department does not suggest an emergent condition requiring admission or immediate intervention beyond what has been performed at this time.  They will follow up with PCP. We also discussed returning to the ED immediately if new or worsening sx occur. We discussed the sx which are most concerning (e.g., sudden worsening pain, fever, inability to tolerate by mouth) that necessitate immediate return. Medications administered to the patient during their visit and any new prescriptions provided to the patient are listed below.  Medications given during this visit Medications - No data to display   The patient appears reasonably screen and/or stabilized for discharge and I doubt any other medical condition or other [facility] requiring further screening, evaluation, or treatment in the ED at this time prior to discharge.       Final diagnoses:  Skin tear of hand without complication,  initial encounter    ED Discharge Orders     None          Emil Share, DO 04/18/24 1542

## 2024-04-18 NOTE — ED Notes (Addendum)
 Skin abrasion on left hand cleaned with normal saline and non-stick pad applied. Wrapped with Kerlix. Bleeding controlled at this time.

## 2024-04-18 NOTE — Discharge Instructions (Signed)
 Please return for redness drainage or fever.  Please follow-up up with your family doctor in the office.

## 2024-04-18 NOTE — ED Triage Notes (Signed)
 Pt POV after missing step when coming down stairs from attic, fell on L hand, on Eliquis , did not hit head, no LOC. Skin tear noted to L hand, bleeding controlled at this time.

## 2024-04-26 ENCOUNTER — Telehealth (HOSPITAL_COMMUNITY): Payer: Self-pay | Admitting: *Deleted

## 2024-04-26 NOTE — Telephone Encounter (Addendum)
 Patient called in stating he developed bleeding from his penis over the weekend but denies blood in urine- he is out of town until end of August. He and his wife decided to hold the PM dose of Eliquis  Saturday - Monday and bleeding has stopped. He denies any other symptoms other than the tip of penis is sore. Denies cut or injury that he is aware of. Pt instructed to resume BID dosing of Eliquis  -- if bleeding returns he should see urgent care immediately for assessment. Pt in agreement.

## 2024-04-28 ENCOUNTER — Ambulatory Visit (INDEPENDENT_AMBULATORY_CARE_PROVIDER_SITE_OTHER)

## 2024-04-28 DIAGNOSIS — I480 Paroxysmal atrial fibrillation: Secondary | ICD-10-CM | POA: Diagnosis not present

## 2024-04-29 ENCOUNTER — Ambulatory Visit: Payer: Self-pay

## 2024-04-29 LAB — CUP PACEART REMOTE DEVICE CHECK
Date Time Interrogation Session: 20250813230954
Implantable Pulse Generator Implant Date: 20220328

## 2024-05-04 ENCOUNTER — Other Ambulatory Visit (HOSPITAL_COMMUNITY): Payer: Self-pay | Admitting: Internal Medicine

## 2024-05-06 ENCOUNTER — Ambulatory Visit (HOSPITAL_COMMUNITY)
Admission: RE | Admit: 2024-05-06 | Discharge: 2024-05-06 | Disposition: A | Discharge status: Home / Self Care | Source: Ambulatory Visit | Attending: Internal Medicine | Admitting: Internal Medicine

## 2024-05-06 VITALS — BP 130/80 | HR 61 | Ht 69.0 in | Wt 201.2 lb

## 2024-05-06 DIAGNOSIS — I4819 Other persistent atrial fibrillation: Secondary | ICD-10-CM | POA: Insufficient documentation

## 2024-05-06 DIAGNOSIS — I4891 Unspecified atrial fibrillation: Secondary | ICD-10-CM | POA: Diagnosis present

## 2024-05-06 DIAGNOSIS — Z5181 Encounter for therapeutic drug level monitoring: Secondary | ICD-10-CM | POA: Diagnosis present

## 2024-05-06 DIAGNOSIS — D6869 Other thrombophilia: Secondary | ICD-10-CM | POA: Diagnosis present

## 2024-05-06 DIAGNOSIS — I4892 Unspecified atrial flutter: Secondary | ICD-10-CM | POA: Diagnosis present

## 2024-05-06 DIAGNOSIS — Z79899 Other long term (current) drug therapy: Secondary | ICD-10-CM | POA: Insufficient documentation

## 2024-05-06 NOTE — Patient Instructions (Addendum)
 Cardioversion scheduled for: 05/17/24 Tuesday  7:30am   - Arrive at the [employer] [facility] ([address])  and check in with ADMITTING at 7:30am   - Do not eat or drink anything after midnight the night prior to your procedure.   - Take all your morning medication (except diabetic medications) with a sip of water prior to arrival.  - Do NOT miss any doses of your blood thinner - if you should miss a dose or take a dose more than 4 hours late -- please notify our office immediately.  - You will not be able to drive home after your procedure. Please ensure you have a responsible adult to drive you home. You will need someone with you for 24 hours post procedure.     - Expect to be in the procedural area approximately 2 hours.   - If you feel as if you go back into normal rhythm prior to scheduled cardioversion, please notify our office immediately.   If your procedure is canceled in the cardioversion suite you will be charged a cancellation fee.

## 2024-05-06 NOTE — Progress Notes (Signed)
 Primary Care Physician: Brown, Shaun ORN, MD Primary Cardiologist: Brown Shaun Lawrence, MD Electrophysiologist: None     Referring Physician: Fairy Frames, MD     Shaun Brown is a 89 y.o. male with a history of CAD s/p CABG, head/neck cancer in remission, AVR, HTN, dyslipidemia, PAD, thoracic aortic aneurysm, and atrial fibrillation/flutter who presents for consultation in the [facility].  Hospital admission 7/16-22/2025 for atrial flutter with RVR possibly secondary to pneumonia diagnosis prior to admission. S/p DCCV on 04/04/24. Patient contacted office on 7/28 noting likely ERAF. Patient is on Eliquis  5 mg BID for a CHADS2VASC score of 4.  On evaluation today, patient is currently in atrial flutter with RVR. He has not missed any doses of Eliquis . He feels tired when out of rhythm.  On follow up 05/06/24, patient is currently in atrial flutter. He began amiodarone  load at last OV on 7/29. He stopped Eliquis  temporarily noted on 8/12 phone call due to bleeding at tip of penis (not hematuria). He resumed Eliquis  on 8/10.   Today, he denies symptoms of chest pain, orthopnea, PND, lower extremity edema, dizziness, presyncope, syncope, snoring, daytime somnolence, bleeding, or neurologic sequela. The patient is tolerating medications without difficulties and is otherwise without complaint today.    he has a BMI of Body mass index is 29.71 kg/m.Shaun Brown Filed Weights   05/06/24 0905  Weight: 91.3 kg     Current Outpatient Medications  Medication Sig Dispense Refill   acetaminophen  (TYLENOL ) 500 MG tablet Take 1,000 mg by mouth 2 (two) times daily as needed for fever, headache or moderate pain (pain score 4-6).     Alcaftadine (LASTACAFT OP) Place 1 drop into both eyes daily as needed (eye irritation).     amiodarone  (PACERONE ) 200 MG tablet Take 1 tablet (200 mg total) by mouth daily. 90 tablet 3   apixaban  (ELIQUIS ) 5 MG TABS tablet Take 1 tablet (5 mg  total) by mouth 2 (two) times daily. 60 tablet 1   aspirin  EC 81 MG tablet Take 81 mg by mouth daily.     atorvastatin  (LIPITOR) 40 MG tablet Take 40 mg by mouth at bedtime.     b complex vitamins tablet Take 1 tablet by mouth daily.     benzonatate (TESSALON) 200 MG capsule Take 200 mg by mouth 3 (three) times daily as needed for cough.     Calcium  Carb-Cholecalciferol (CALCIUM  + VITAMIN D3 PO) Take 1 tablet by mouth daily.     Cholecalciferol (VITAMIN D-3 PO) Take 1 capsule by mouth daily.     losartan  (COZAAR ) 25 MG tablet 1 tablet Orally Once a day     metoprolol  succinate (TOPROL  XL) 25 MG 24 hr tablet Take 1 tablet (25 mg total) by mouth daily. 30 tablet 11   nitroGLYCERIN  (NITROSTAT ) 0.4 MG SL tablet Place 1 tablet (0.4 mg total) under the tongue every 5 (five) minutes as needed for chest pain. 25 tablet 3   omeprazole (PRILOSEC) 20 MG capsule Take 20 mg by mouth daily.     sodium fluoride (DENTAGEL) 1.1 % GEL dental gel Place 1 Application onto teeth at bedtime.     No current facility-administered medications for this encounter.    Atrial Fibrillation Management history:  Previous antiarrhythmic drugs: amiodarone  Previous cardioversions: 04/04/24 Previous ablations: none Anticoagulation history: Eliquis    ROS- All systems are reviewed and negative except as per the HPI above.  Physical Exam: BP 130/80   Pulse 61   Ht  5' 9 (1.753 m)   Wt 91.3 kg   BMI 29.71 kg/m   GEN- The patient is well appearing, alert and oriented x 3 today.   Neck - no JVD or carotid bruit noted Lungs- Clear to ausculation bilaterally, normal work of breathing Heart- Irregular rate and rhythm, no murmurs, rubs or gallops, PMI not laterally displaced Extremities- no clubbing, cyanosis, or edema Skin - no rash or ecchymosis noted   EKG today demonstrates  Vent. rate 61 BPM PR interval * ms QRS duration 148 ms QT/QTcB 446/448 ms P-R-T axes 18 -78 45 Atrial flutter with variable A-V  block Right bundle branch block Left anterior fascicular block Bifascicular block Abnormal ECG When compared with ECG of 12-Apr-2024 14:41, HR has decreased   Echo 04/01/24 demonstrated   1. Left ventricular ejection fraction, by estimation, is 40 to 45%. The  left ventricle has mildly decreased function. Left ventricular diastolic  parameters are indeterminate.   2. Right ventricular systolic function is normal. The right ventricular  size is normal.   3. Left atrial size was severely dilated.   4. Mild mitral valve regurgitation.   5. The aortic valve is tricuspid. Aortic valve regurgitation is not  visualized. Aortic valve sclerosis/calcification is present, without any  evidence of aortic stenosis.   6. Aortic dilatation noted. Aneurysm of the ascending aorta, measuring 50  mm.   ASSESSMENT & PLAN CHA2DS2-VASc Score = 4  The patient's score is based upon: CHF History: 0 HTN History: 1 Diabetes History: 0 Stroke History: 0 Vascular Disease History: 1 Age Score: 2 Gender Score: 0       ASSESSMENT AND PLAN: Persistent Atrial Flutter (ICD10:  I48.0) The patient's CHA2DS2-VASc score is 4, indicating a 4.8% annual risk of stroke.    He is currently in atrial flutter. He began amiodarone  load at last visit on 7/29 and will transition to 200 mg once daily in a few days. He would like to proceed with cardioversion. We discussed the procedure cardioversion to try to convert to NSR. We discussed the risks vs benefits of this procedure and how ultimately we cannot predict whether a patient will have early return of arrhythmia post procedure. After discussion, the patient wishes to proceed with cardioversion. Labs drawn today.   Informed Consent   Shared Decision Making/Informed Consent The risks (stroke, cardiac arrhythmias rarely resulting in the need for a temporary or permanent pacemaker, skin irritation or burns and complications associated with conscious sedation including  aspiration, arrhythmia, respiratory failure and death), benefits (restoration of normal sinus rhythm) and alternatives of a direct current cardioversion were explained in detail to [name] and he agrees to proceed.      High risk medication monitoring (ICD10: [id number]) Patient requires ongoing monitoring for anti-arrhythmic medication which has the potential to cause life threatening arrhythmias or AV block. Qtc stable. Transition to amiodarone  200 mg once daily after amiodarone  load is completed.   Secondary Hypercoagulable State (ICD10:  D68.69) The patient is at significant risk for stroke/thromboembolism based upon his CHA2DS2-VASc Score of 4.  Continue Apixaban  (Eliquis ).  He held several doses of Eliquis  due to bleeding at tip of penis. He resumed Eliquis  on 8/10.     Follow up 2 weeks after DCCV.   Terra Pac, PA-C  [facility] [address] [place], [place] [address] [phone]

## 2024-05-06 NOTE — H&P (View-Only) (Signed)
 Primary Care Physician: Brown, Shaun ORN, MD Primary Cardiologist: Shaun JINNY Lawrence, MD Electrophysiologist: None     Referring Physician: Fairy Frames, MD     Shaun Brown is a 89 y.o. male with a history of CAD s/p CABG, head/neck cancer in remission, AVR, HTN, dyslipidemia, PAD, thoracic aortic aneurysm, and atrial fibrillation/flutter who presents for consultation in the [facility].  Hospital admission 7/16-22/2025 for atrial flutter with RVR possibly secondary to pneumonia diagnosis prior to admission. S/p DCCV on 04/04/24. Patient contacted office on 7/28 noting likely ERAF. Patient is on Eliquis  5 mg BID for a CHADS2VASC score of 4.  On evaluation today, patient is currently in atrial flutter with RVR. He has not missed any doses of Eliquis . He feels tired when out of rhythm.  On follow up 05/06/24, patient is currently in atrial flutter. He began amiodarone  load at last OV on 7/29. He stopped Eliquis  temporarily noted on 8/12 phone call due to bleeding at tip of penis (not hematuria). He resumed Eliquis  on 8/10.   Today, he denies symptoms of chest pain, orthopnea, PND, lower extremity edema, dizziness, presyncope, syncope, snoring, daytime somnolence, bleeding, or neurologic sequela. The patient is tolerating medications without difficulties and is otherwise without complaint today.    he has a BMI of Body mass index is 29.71 kg/m.Shaun Brown Filed Weights   05/06/24 0905  Weight: 91.3 kg     Current Outpatient Medications  Medication Sig Dispense Refill   acetaminophen  (TYLENOL ) 500 MG tablet Take 1,000 mg by mouth 2 (two) times daily as needed for fever, headache or moderate pain (pain score 4-6).     Alcaftadine (LASTACAFT OP) Place 1 drop into both eyes daily as needed (eye irritation).     amiodarone  (PACERONE ) 200 MG tablet Take 1 tablet (200 mg total) by mouth daily. 90 tablet 3   apixaban  (ELIQUIS ) 5 MG TABS tablet Take 1 tablet (5 mg  total) by mouth 2 (two) times daily. 60 tablet 1   aspirin  EC 81 MG tablet Take 81 mg by mouth daily.     atorvastatin  (LIPITOR) 40 MG tablet Take 40 mg by mouth at bedtime.     b complex vitamins tablet Take 1 tablet by mouth daily.     benzonatate (TESSALON) 200 MG capsule Take 200 mg by mouth 3 (three) times daily as needed for cough.     Calcium  Carb-Cholecalciferol (CALCIUM  + VITAMIN D3 PO) Take 1 tablet by mouth daily.     Cholecalciferol (VITAMIN D-3 PO) Take 1 capsule by mouth daily.     losartan  (COZAAR ) 25 MG tablet 1 tablet Orally Once a day     metoprolol  succinate (TOPROL  XL) 25 MG 24 hr tablet Take 1 tablet (25 mg total) by mouth daily. 30 tablet 11   nitroGLYCERIN  (NITROSTAT ) 0.4 MG SL tablet Place 1 tablet (0.4 mg total) under the tongue every 5 (five) minutes as needed for chest pain. 25 tablet 3   omeprazole (PRILOSEC) 20 MG capsule Take 20 mg by mouth daily.     sodium fluoride (DENTAGEL) 1.1 % GEL dental gel Place 1 Application onto teeth at bedtime.     No current facility-administered medications for this encounter.    Atrial Fibrillation Management history:  Previous antiarrhythmic drugs: amiodarone  Previous cardioversions: 04/04/24 Previous ablations: none Anticoagulation history: Eliquis    ROS- All systems are reviewed and negative except as per the HPI above.  Physical Exam: BP 130/80   Pulse 61   Ht  5' 9 (1.753 m)   Wt 91.3 kg   BMI 29.71 kg/m   GEN- The patient is well appearing, alert and oriented x 3 today.   Neck - no JVD or carotid bruit noted Lungs- Clear to ausculation bilaterally, normal work of breathing Heart- Irregular rate and rhythm, no murmurs, rubs or gallops, PMI not laterally displaced Extremities- no clubbing, cyanosis, or edema Skin - no rash or ecchymosis noted   EKG today demonstrates  Vent. rate 61 BPM PR interval * ms QRS duration 148 ms QT/QTcB 446/448 ms P-R-T axes 18 -78 45 Atrial flutter with variable A-V  block Right bundle branch block Left anterior fascicular block Bifascicular block Abnormal ECG When compared with ECG of 12-Apr-2024 14:41, HR has decreased   Echo 04/01/24 demonstrated   1. Left ventricular ejection fraction, by estimation, is 40 to 45%. The  left ventricle has mildly decreased function. Left ventricular diastolic  parameters are indeterminate.   2. Right ventricular systolic function is normal. The right ventricular  size is normal.   3. Left atrial size was severely dilated.   4. Mild mitral valve regurgitation.   5. The aortic valve is tricuspid. Aortic valve regurgitation is not  visualized. Aortic valve sclerosis/calcification is present, without any  evidence of aortic stenosis.   6. Aortic dilatation noted. Aneurysm of the ascending aorta, measuring 50  mm.   ASSESSMENT & PLAN CHA2DS2-VASc Score = 4  The patient's score is based upon: CHF History: 0 HTN History: 1 Diabetes History: 0 Stroke History: 0 Vascular Disease History: 1 Age Score: 2 Gender Score: 0       ASSESSMENT AND PLAN: Persistent Atrial Flutter (ICD10:  I48.0) The patient's CHA2DS2-VASc score is 4, indicating a 4.8% annual risk of stroke.    He is currently in atrial flutter. He began amiodarone  load at last visit on 7/29 and will transition to 200 mg once daily in a few days. He would like to proceed with cardioversion. We discussed the procedure cardioversion to try to convert to NSR. We discussed the risks vs benefits of this procedure and how ultimately we cannot predict whether a patient will have early return of arrhythmia post procedure. After discussion, the patient wishes to proceed with cardioversion. Labs drawn today.   Informed Consent   Shared Decision Making/Informed Consent The risks (stroke, cardiac arrhythmias rarely resulting in the need for a temporary or permanent pacemaker, skin irritation or burns and complications associated with conscious sedation including  aspiration, arrhythmia, respiratory failure and death), benefits (restoration of normal sinus rhythm) and alternatives of a direct current cardioversion were explained in detail to Shaun Brown and he agrees to proceed.      High risk medication monitoring (ICD10: U5195107) Patient requires ongoing monitoring for anti-arrhythmic medication which has the potential to cause life threatening arrhythmias or AV block. Qtc stable. Transition to amiodarone  200 mg once daily after amiodarone  load is completed.   Secondary Hypercoagulable State (ICD10:  D68.69) The patient is at significant risk for stroke/thromboembolism based upon his CHA2DS2-VASc Score of 4.  Continue Apixaban  (Eliquis ).  He held several doses of Eliquis  due to bleeding at tip of penis. He resumed Eliquis  on 8/10.     Follow up 2 weeks after DCCV.   Terra Pac, PA-C  [facility] Clinic [facility] [address] [place], [place] [address] [phone]

## 2024-05-07 LAB — BASIC METABOLIC PANEL WITH GFR
BUN/Creatinine Ratio: 13 (ref 10–24)
BUN: 20 mg/dL (ref 8–27)
CO2: 21 mmol/L (ref 20–29)
Calcium: 8.5 mg/dL — ABNORMAL LOW (ref 8.6–10.2)
Chloride: 105 mmol/L (ref 96–106)
Creatinine, Ser: 1.51 mg/dL — ABNORMAL HIGH (ref 0.76–1.27)
Glucose: 122 mg/dL — ABNORMAL HIGH (ref 70–99)
Potassium: 4.6 mmol/L (ref 3.5–5.2)
Sodium: 143 mmol/L (ref 134–144)
eGFR: 44 mL/min/1.73 — ABNORMAL LOW (ref >59)

## 2024-05-07 LAB — CBC
Hematocrit: 33.3 % — ABNORMAL LOW (ref 37.5–51.0)
Hemoglobin: 10 g/dL — ABNORMAL LOW (ref 13.0–17.7)
MCH: 27.7 pg (ref 26.6–33.0)
MCHC: 30 g/dL — ABNORMAL LOW (ref 31.5–35.7)
MCV: 92 fL (ref 79–97)
Platelets: 170 x10E3/uL (ref 150–450)
RBC: 3.61 x10E6/uL — ABNORMAL LOW (ref 4.14–5.80)
RDW: 13.4 % (ref 11.6–15.4)
WBC: 4.9 x10E3/uL (ref 3.4–10.8)

## 2024-05-10 ENCOUNTER — Ambulatory Visit (HOSPITAL_COMMUNITY): Payer: Self-pay | Admitting: Internal Medicine

## 2024-05-11 ENCOUNTER — Other Ambulatory Visit (HOSPITAL_COMMUNITY): Payer: Self-pay

## 2024-05-13 NOTE — Progress Notes (Signed)
 Spoke to patient and instructed them to come at 0730  and to be NPO after 0000.     Confirmed that patient will have a ride home and someone to stay with them for 24 hours after the procedure.   Medications reviewed.  Confirmed blood thinner.  Confirmed no breaks in taking blood thinner for 3+ weeks prior to procedure.

## 2024-05-16 NOTE — Anesthesia Preprocedure Evaluation (Signed)
 Anesthesia Evaluation  Patient identified by MRN, date of birth, ID band Patient awake    Reviewed: Allergy & Precautions, NPO status , Patient's Chart, lab work & pertinent test results  History of Anesthesia Complications Negative for: history of anesthetic complications  Airway Mallampati: III  TM Distance: >3 FB Neck ROM: Full    Dental  (+) Partial Lower, Partial Upper   Pulmonary former smoker   Pulmonary exam normal        Cardiovascular hypertension, Pt. on medications and Pt. on home beta blockers + CAD, + Past MI, + CABG and + Peripheral Vascular Disease  Normal cardiovascular exam+ dysrhythmias (on Eliquis  and amiodarone ) Atrial Fibrillation   TTE 04/04/24: EF 40-45%, global hypokinesis, mild LVE, severe LAE, mild RAE, severe LAE, mild MR, severe dilatation of the ascending aorta measuring 50mm    Neuro/Psych negative neurological ROS  negative psych ROS   GI/Hepatic Neg liver ROS,GERD  Medicated,,  Endo/Other  negative endocrine ROS    Renal/GU Renal InsufficiencyRenal disease (Cr 1.51)     Musculoskeletal negative musculoskeletal ROS (+)    Abdominal   Peds  Hematology  (+) Blood dyscrasia (Hgb 10.0), anemia   Anesthesia Other Findings Day of surgery medications reviewed with patient.  Reproductive/Obstetrics                              Anesthesia Physical Anesthesia Plan  ASA: 3  Anesthesia Plan: General   Post-op Pain Management: Minimal or no pain anticipated   Induction: Intravenous  PONV Risk Score and Plan: 2 and Treatment may vary due to age or medical condition and Propofol  infusion  Airway Management Planned: Mask  Additional Equipment: None  Intra-op Plan:   Post-operative Plan:   Informed Consent: I have reviewed the patients History and Physical, chart, labs and discussed the procedure including the risks, benefits and alternatives for the proposed  anesthesia with the patient or authorized representative who has indicated his/her understanding and acceptance.       Plan Discussed with: CRNA  Anesthesia Plan Comments:          Anesthesia Quick Evaluation

## 2024-05-17 ENCOUNTER — Ambulatory Visit (HOSPITAL_COMMUNITY)
Admission: RE | Admit: 2024-05-17 | Discharge: 2024-05-17 | Disposition: A | Discharge status: Home / Self Care | Attending: Cardiology | Admitting: Cardiology

## 2024-05-17 ENCOUNTER — Ambulatory Visit (HOSPITAL_COMMUNITY): Payer: Self-pay

## 2024-05-17 ENCOUNTER — Encounter (HOSPITAL_COMMUNITY): Payer: Self-pay | Admitting: Cardiology

## 2024-05-17 ENCOUNTER — Other Ambulatory Visit: Payer: Self-pay

## 2024-05-17 ENCOUNTER — Encounter (HOSPITAL_COMMUNITY)
Admission: RE | Disposition: A | Discharge status: Home / Self Care | Payer: Self-pay | Source: Home / Self Care | Attending: Cardiology

## 2024-05-17 DIAGNOSIS — Z7901 Long term (current) use of anticoagulants: Secondary | ICD-10-CM | POA: Insufficient documentation

## 2024-05-17 DIAGNOSIS — I4892 Unspecified atrial flutter: Secondary | ICD-10-CM | POA: Diagnosis not present

## 2024-05-17 DIAGNOSIS — I4891 Unspecified atrial fibrillation: Secondary | ICD-10-CM | POA: Diagnosis present

## 2024-05-17 DIAGNOSIS — I2510 Atherosclerotic heart disease of native coronary artery without angina pectoris: Secondary | ICD-10-CM | POA: Insufficient documentation

## 2024-05-17 DIAGNOSIS — I252 Old myocardial infarction: Secondary | ICD-10-CM | POA: Insufficient documentation

## 2024-05-17 DIAGNOSIS — K219 Gastro-esophageal reflux disease without esophagitis: Secondary | ICD-10-CM | POA: Diagnosis not present

## 2024-05-17 DIAGNOSIS — Z87891 Personal history of nicotine dependence: Secondary | ICD-10-CM

## 2024-05-17 DIAGNOSIS — I10 Essential (primary) hypertension: Secondary | ICD-10-CM

## 2024-05-17 DIAGNOSIS — Z951 Presence of aortocoronary bypass graft: Secondary | ICD-10-CM | POA: Insufficient documentation

## 2024-05-17 DIAGNOSIS — Z79899 Other long term (current) drug therapy: Secondary | ICD-10-CM | POA: Insufficient documentation

## 2024-05-17 HISTORY — PX: CARDIOVERSION: EP1203

## 2024-05-17 MED ORDER — LIDOCAINE 2% (20 MG/ML) 5 ML SYRINGE
INTRAMUSCULAR | Status: DC | PRN
  Administered 2024-05-17: 60 mg via INTRAVENOUS

## 2024-05-17 MED ORDER — SODIUM CHLORIDE 0.9% FLUSH
INTRAVENOUS | Status: DC | PRN
Start: 2024-05-17 — End: 2024-05-17
  Administered 2024-05-17: 3 mL via INTRAVENOUS

## 2024-05-17 MED ORDER — PROPOFOL 10 MG/ML IV BOLUS
INTRAVENOUS | Status: DC | PRN
  Administered 2024-05-17: 50 mg via INTRAVENOUS

## 2024-05-17 NOTE — Interval H&P Note (Signed)
 History and Physical Interval Note:  05/17/2024 7:47 AM  Shaun Brown  has presented today for surgery, with the diagnosis of AFIB.  The various methods of treatment have been discussed with the patient and family. After consideration of risks, benefits and other options for treatment, the patient has consented to  Procedure(s): CARDIOVERSION (N/A) as a surgical intervention.  The patient's history has been reviewed, patient examined, no change in status, stable for surgery.  I have reviewed the patient's chart and labs.  Questions were answered to the patient's satisfaction.     Shaun Brown

## 2024-05-17 NOTE — CV Procedure (Signed)
   Electrical Cardioversion Procedure Note Shaun Brown [id number] 12/30/1931  Procedure: Electrical Cardioversion Indications:  Atrial Fibrillation  Time Out: Verified patient identification, verified procedure,medications/allergies/relevent history reviewed, required imaging and test results available.  Performed  Procedure Details  The patient signed informed consent.   The patient was NPO past midnight. Has had therapeutic anticoagulation with Eliquis  greater than 3 weeks. The patient denies any interruption of anticoagulation.  Anesthesia was administered by Dr. Paul.  Adequate airway was maintained throughout and vital followed per protocol.  He was cardioverted x 1 with 200J of biphasic synchronized energy.  He converted to NSR.  There were no apparent complications.  The patient tolerated the procedure well and had normal neuro status and respiratory status post procedure with vitals stable as recorded elsewhere.     IMPRESSION:  Successful cardioversion of atrial fibrillation   Follow up:  We will arrange follow up with his primary cardiologist.  He will continue on current medical therapy.  The patient advised to continue anticoagulation.  He is bradycardic post procedure. Hold Toprol  xl tonight and then take Toprol  xl 12.5 mg ( half a pill) tomorrow - start back on toprol  xl 25 mg on Thursday  Shaun Brown 05/17/2024, 12:54 PM

## 2024-05-17 NOTE — Discharge Instructions (Signed)
 Hold Toprol  XL (metoprolol ) today.  Take 1/2 tablet 12.5 mg tomorrow 9/3. Take whole tablet 25 mg Thursday 9/4.

## 2024-05-17 NOTE — Anesthesia Postprocedure Evaluation (Signed)
 Anesthesia Post Note  Patient: Shaun Brown  Procedure(s) Performed: CARDIOVERSION     Patient location during evaluation: PACU Anesthesia Type: General Level of consciousness: awake and alert Pain management: pain level controlled Vital Signs Assessment: post-procedure vital signs reviewed and stable Respiratory status: spontaneous breathing, nonlabored ventilation and respiratory function stable Cardiovascular status: blood pressure returned to baseline Postop Assessment: no apparent nausea or vomiting Anesthetic complications: no   No notable events documented.  Last Vitals:  Vitals:   05/17/24 1250 05/17/24 1300  BP: 109/71 107/69  Pulse: (!) 37 (!) 40  Resp: 11 (!) 0  Temp:    SpO2: 100% 98%    Last Pain:  Vitals:   05/17/24 1245  TempSrc: Temporal  PainSc: Asleep                 Vertell Row

## 2024-05-17 NOTE — Transfer of Care (Signed)
 Immediate Anesthesia Transfer of Care Note  Patient: Shaun Brown  Procedure(s) Performed: CARDIOVERSION  Patient Location: Cath Lab  Anesthesia Type:MAC  Level of Consciousness: drowsy  Airway & Oxygen Therapy: Patient Spontanous Breathing and Patient connected to nasal cannula oxygen  Post-op Assessment: Report given to RN and Post -op Vital signs reviewed and stable  Post vital signs: Reviewed and stable  Last Vitals:  Vitals Value Taken Time  BP 99/67 05/17/24 1239  Temp    Pulse 38 05/17/24 1239  Resp 17 05/17/24 1239  SpO2 100% 05/17/24 1239    Last Pain:  Vitals:   05/17/24 0740  TempSrc: Temporal         Complications: No notable events documented.

## 2024-05-18 ENCOUNTER — Telehealth: Payer: Self-pay

## 2024-05-18 NOTE — Telephone Encounter (Signed)
 Alert received from [employer] for repeat Electrical Reset - route to triage. Presenting rhythm from 9/3 NSR.  Attempted to call [employer], stayed on hold for extended length period of time.   Will call back at later time.   Reset was completed to [facility].

## 2024-05-18 NOTE — Telephone Encounter (Addendum)
 Spoke to [place] at [employer].   Sari advised patient does not need to come in for reprogramming. Program settings returned to previous settings.   No further action needed at this time.

## 2024-05-30 ENCOUNTER — Ambulatory Visit (INDEPENDENT_AMBULATORY_CARE_PROVIDER_SITE_OTHER)

## 2024-05-30 DIAGNOSIS — I480 Paroxysmal atrial fibrillation: Secondary | ICD-10-CM

## 2024-05-31 ENCOUNTER — Ambulatory Visit (HOSPITAL_COMMUNITY)
Admission: RE | Admit: 2024-05-31 | Discharge: 2024-05-31 | Disposition: A | Discharge status: Home / Self Care | Source: Ambulatory Visit | Attending: Internal Medicine | Admitting: Internal Medicine

## 2024-05-31 ENCOUNTER — Encounter (HOSPITAL_COMMUNITY): Payer: Self-pay | Admitting: Internal Medicine

## 2024-05-31 VITALS — BP 134/88 | HR 45 | Ht 69.0 in | Wt 201.8 lb

## 2024-05-31 DIAGNOSIS — Z5181 Encounter for therapeutic drug level monitoring: Secondary | ICD-10-CM | POA: Diagnosis not present

## 2024-05-31 DIAGNOSIS — D6869 Other thrombophilia: Secondary | ICD-10-CM | POA: Insufficient documentation

## 2024-05-31 DIAGNOSIS — Z79899 Other long term (current) drug therapy: Secondary | ICD-10-CM | POA: Diagnosis not present

## 2024-05-31 DIAGNOSIS — I4819 Other persistent atrial fibrillation: Secondary | ICD-10-CM | POA: Insufficient documentation

## 2024-05-31 LAB — CUP PACEART REMOTE DEVICE CHECK
Date Time Interrogation Session: 20250913230850
Implantable Pulse Generator Implant Date: 20220328

## 2024-05-31 NOTE — Patient Instructions (Signed)
 Stop metoprolol

## 2024-05-31 NOTE — Progress Notes (Signed)
 Primary Care Physician: Tisovec, Charlie ORN, MD Primary Cardiologist: Newman JINNY Lawrence, MD Electrophysiologist: None     Referring Physician: Fairy Frames, MD     Shaun Brown is a 89 y.o. male with a history of CAD s/p CABG, head/neck cancer in remission, AVR, HTN, dyslipidemia, PAD, thoracic aortic aneurysm, and atrial fibrillation/flutter who presents for consultation in the [facility].  Hospital admission 7/16-22/2025 for atrial flutter with RVR possibly secondary to pneumonia diagnosis prior to admission. S/p DCCV on 04/04/24. Patient contacted office on 7/28 noting likely ERAF. Patient is on Eliquis  5 mg BID for a CHADS2VASC score of 4.  On evaluation today, patient is currently in atrial flutter with RVR. He has not missed any doses of Eliquis . He feels tired when out of rhythm.  On follow up 05/06/24, patient is currently in atrial flutter. He began amiodarone  load at last OV on 7/29. He stopped Eliquis  temporarily noted on 8/12 phone call due to bleeding at tip of penis (not hematuria). He resumed Eliquis  on 8/10.   On follow up 05/31/24, patient is currently in NSR. S/p DCCV on 05/17/24. He is taking amiodarone  200 mg daily. He notes feeling very tired with low HR. No missed doses of Eliquis .   Today, he denies symptoms of chest pain, orthopnea, PND, lower extremity edema, dizziness, presyncope, syncope, snoring, daytime somnolence, bleeding, or neurologic sequela. The patient is tolerating medications without difficulties and is otherwise without complaint today.    he has a BMI of Body mass index is 29.8 kg/m.SABRA Filed Weights   05/31/24 0902  Weight: 91.5 kg      Current Outpatient Medications  Medication Sig Dispense Refill   acetaminophen  (TYLENOL ) 500 MG tablet Take 1,000 mg by mouth 2 (two) times daily as needed for fever, headache or moderate pain (pain score 4-6).     Alcaftadine (LASTACAFT) 0.25 % SOLN Place 1 drop into both eyes  daily as needed (eye irritation).     amiodarone  (PACERONE ) 200 MG tablet Take 1 tablet (200 mg total) by mouth daily. 90 tablet 3   apixaban  (ELIQUIS ) 5 MG TABS tablet Take 1 tablet (5 mg total) by mouth 2 (two) times daily. 60 tablet 1   aspirin  EC 81 MG tablet Take 81 mg by mouth daily.     atorvastatin  (LIPITOR) 40 MG tablet Take 40 mg by mouth at bedtime.     b complex vitamins tablet Take 1 tablet by mouth daily.     benzonatate (TESSALON) 200 MG capsule Take 200 mg by mouth 3 (three) times daily as needed for cough.     Calcium  Carb-Cholecalciferol (CALCIUM  + VITAMIN D3 PO) Take 1 tablet by mouth daily.     Cholecalciferol (VITAMIN D-3 PO) Take 1 capsule by mouth daily.     losartan  (COZAAR ) 25 MG tablet Take 25 mg by mouth daily.     nitroGLYCERIN  (NITROSTAT ) 0.4 MG SL tablet Place 1 tablet (0.4 mg total) under the tongue every 5 (five) minutes as needed for chest pain. 25 tablet 3   omeprazole (PRILOSEC) 20 MG capsule Take 20 mg by mouth daily.     sodium fluoride (DENTAGEL) 1.1 % GEL dental gel Place 1 Application onto teeth at bedtime.     No current facility-administered medications for this encounter.    Atrial Fibrillation Management history:  Previous antiarrhythmic drugs: amiodarone  Previous cardioversions: 04/04/24, 05/17/24 Previous ablations: none Anticoagulation history: Eliquis    ROS- All systems are reviewed and negative except as  per the HPI above.  Physical Exam: BP 134/88   Pulse (!) 45   Ht 5' 9 (1.753 m)   Wt 91.5 kg   BMI 29.80 kg/m   GEN- The patient is well appearing, alert and oriented x 3 today.   Neck - no JVD or carotid bruit noted Lungs- Clear to ausculation bilaterally, normal work of breathing Heart- Regular bradycardic rate and rhythm, no murmurs, rubs or gallops, PMI not laterally displaced Extremities- no clubbing, cyanosis, or edema Skin - no rash or ecchymosis noted   EKG today demonstrates  Vent. rate 45 BPM PR interval 216 ms QRS  duration 142 ms QT/QTcB 512/442 ms P-R-T axes 114 -67 17 Sinus bradycardia with 1st degree A-V block Right bundle branch block Left anterior fascicular block Bifascicular block Abnormal ECG When compared with ECG of 17-May-2024 12:38, Right bundle branch block has replaced Non-specific intra-ventricular conduction block   Echo 04/01/24 demonstrated   1. Left ventricular ejection fraction, by estimation, is 40 to 45%. The  left ventricle has mildly decreased function. Left ventricular diastolic  parameters are indeterminate.   2. Right ventricular systolic function is normal. The right ventricular  size is normal.   3. Left atrial size was severely dilated.   4. Mild mitral valve regurgitation.   5. The aortic valve is tricuspid. Aortic valve regurgitation is not  visualized. Aortic valve sclerosis/calcification is present, without any  evidence of aortic stenosis.   6. Aortic dilatation noted. Aneurysm of the ascending aorta, measuring 50  mm.   ASSESSMENT & PLAN CHA2DS2-VASc Score = 4  The patient's score is based upon: CHF History: 0 HTN History: 1 Diabetes History: 0 Stroke History: 0 Vascular Disease History: 1 Age Score: 2 Gender Score: 0       ASSESSMENT AND PLAN: Persistent Atrial Flutter (ICD10:  I48.0) The patient's CHA2DS2-VASc score is 4, indicating a 4.8% annual risk of stroke.   S/p DCCV on 05/17/24.  Patient is currently in NSR. He feels unwell with low HR. Will stop Toprol  and continue amiodarone .    High risk medication monitoring (ICD10: U5195107) Patient requires ongoing monitoring for anti-arrhythmic medication which has the potential to cause life threatening arrhythmias or AV block. Qtc stable. Continue amiodarone  200 mg daily.   Secondary Hypercoagulable State (ICD10:  D68.69) The patient is at significant risk for stroke/thromboembolism based upon his CHA2DS2-VASc Score of 4.  Continue Apixaban  (Eliquis ).  Continue Eliquis .     Follow up 6  months Afib clinic.    Terra Pac, PA-C  [facility] [address] [place], [place] [address] [phone]

## 2024-06-02 ENCOUNTER — Ambulatory Visit: Payer: Self-pay | Admitting: Cardiology

## 2024-06-06 NOTE — Progress Notes (Signed)
 Remote Loop Recorder Transmission

## 2024-06-09 NOTE — Progress Notes (Signed)
 Remote Loop Recorder Transmission

## 2024-06-22 ENCOUNTER — Telehealth (HOSPITAL_COMMUNITY): Payer: Self-pay | Admitting: *Deleted

## 2024-06-22 NOTE — Telephone Encounter (Signed)
 Patient wife called in stating his heart rate is in the 30-40s today. Currently HR is 32 BP 89/54. Feels tired and intermittently dizzy. Discussed with Thom Heinrich PA will stop amiodarone  and losartan . Call with update of BP/HR tomorrow. ER precautions reviewed with patient and wife.

## 2024-06-22 NOTE — Progress Notes (Signed)
 Remote Loop Recorder Transmission

## 2024-06-30 ENCOUNTER — Encounter

## 2024-06-30 MED ORDER — LOSARTAN POTASSIUM 25 MG PO TABS: 25.0000 mg | ORAL_TABLET | Freq: Every day | ORAL | Status: AC

## 2024-06-30 NOTE — Addendum Note (Signed)
 Addended by: Laniqua Torrens R on: 06/30/2024 04:00 PM   Modules accepted: Orders

## 2024-06-30 NOTE — Telephone Encounter (Signed)
 Spoke with wife states patients Hr 50-60 has been consistent since stopping amiodarone . They have concerns about BP  fluctuating  SBP 180-90/ 60. Pt did add losartan  25 mg daily back because SBP was getting elevated 180. I discussed with J.Suarez P.A. no new changes at this time he recommends to follow up with PCP for BP management.

## 2024-07-01 ENCOUNTER — Ambulatory Visit

## 2024-07-01 ENCOUNTER — Telehealth: Payer: Self-pay | Admitting: Cardiology

## 2024-07-01 DIAGNOSIS — I4819 Other persistent atrial fibrillation: Secondary | ICD-10-CM | POA: Diagnosis not present

## 2024-07-01 NOTE — Telephone Encounter (Signed)
 I can see him on 10/29. I have 21 pts that day, there should be 3 slots open. Can override a f./u visit slot if no new pt slots available that day. Hope that helps.  Thanks MJP

## 2024-07-01 NOTE — Telephone Encounter (Signed)
 Pt called in stating someone told him to make an appt with Dr. Elmira to reestablish care with gen cards.. last seen 11/2020. He is a NP now. Next available is 11/25. Pt states he cannot wait that long and requested to speak with Dr. Elmira.

## 2024-07-01 NOTE — Telephone Encounter (Signed)
 Patient scheduled for 10/29 at 10AM with Dr. Elmira

## 2024-07-03 LAB — CUP PACEART REMOTE DEVICE CHECK
Date Time Interrogation Session: 20251016231343
Implantable Pulse Generator Implant Date: 20220328

## 2024-07-04 ENCOUNTER — Ambulatory Visit: Payer: Self-pay | Admitting: Cardiology

## 2024-07-05 NOTE — Progress Notes (Signed)
 Remote Loop Recorder Transmission

## 2024-07-13 ENCOUNTER — Encounter: Payer: Self-pay | Admitting: Cardiology

## 2024-07-13 ENCOUNTER — Ambulatory Visit: Attending: Cardiology | Admitting: Cardiology

## 2024-07-13 ENCOUNTER — Ambulatory Visit: Admitting: Cardiology

## 2024-07-13 VITALS — BP 121/72 | HR 54 | Ht 68.0 in | Wt 203.0 lb

## 2024-07-13 DIAGNOSIS — I10 Essential (primary) hypertension: Secondary | ICD-10-CM | POA: Insufficient documentation

## 2024-07-13 DIAGNOSIS — E782 Mixed hyperlipidemia: Secondary | ICD-10-CM | POA: Insufficient documentation

## 2024-07-13 DIAGNOSIS — I25118 Atherosclerotic heart disease of native coronary artery with other forms of angina pectoris: Secondary | ICD-10-CM | POA: Diagnosis not present

## 2024-07-13 DIAGNOSIS — R0609 Other forms of dyspnea: Secondary | ICD-10-CM | POA: Insufficient documentation

## 2024-07-13 DIAGNOSIS — I7121 Aneurysm of the ascending aorta, without rupture: Secondary | ICD-10-CM | POA: Insufficient documentation

## 2024-07-13 NOTE — Patient Instructions (Signed)
 Medication Instructions:  STOP Aspirin    *If you need a refill on your cardiac medications before your next appointment, please call your pharmacy*  Testing/Procedures: Lexiscan  Your physician has requested that you have a lexiscan myoview. For further information please visit [email or web address] Please follow instruction sheet, as given.  Echocardiogram  Your physician has requested that you have an echocardiogram. Echocardiography is a painless test that uses sound waves to create images of your heart. It provides your doctor with information about the size and shape of your heart and how well your heart's chambers and valves are working. This procedure takes approximately one hour. There are no restrictions for this procedure. Please do NOT wear cologne, perfume, aftershave, or lotions (deodorant is allowed). Please arrive 15 minutes prior to your appointment time.  Please note: We ask at that you not bring children with you during ultrasound (echo/ vascular) testing. Due to room size and safety concerns, children are not allowed in the ultrasound rooms during exams. Our front office staff cannot provide observation of children in our lobby area while testing is being conducted. An adult accompanying a patient to their appointment will only be allowed in the ultrasound room at the discretion of the ultrasound technician under special circumstances. We apologize for any inconvenience.   Follow-Up: At [facility], you and your health needs are our priority.  As part of our continuing mission to provide you with exceptional heart care, our providers are all part of one team.  This team includes your primary Cardiologist (physician) and Advanced Practice Providers or APPs (Physician Assistants and Nurse Practitioners) who all work together to provide you with the care you need, when you need it.  Your next appointment:   6-8 week(s)  Provider:   Newman JINNY Lawrence, MD

## 2024-07-13 NOTE — Progress Notes (Signed)
 Cardiology Office Note:  .   Date:  07/13/2024  ID:  Shaun Brown, DOB 05-23-1932, MRN [id number] PCP: Vernadine Charlie ORN, MD  [facility] Providers Cardiologist:  Newman Lawrence, MD PCP: Vernadine Charlie ORN, MD  Chief Complaint  Patient presents with   Coronary Artery Disease     Shaun Brown is a 89 y.o. male with hypertension, hyperlipidemia, bioprosthetic aortic valve replacement (Carpentier-Edwards Perimount prosthetic valve size #25) and aortic aneurysm repair in 2017 [facility], [place] ), CAD s/p stent for MI in 2006, [place], h/o head and neck cancer s/p chemotherapy & radiation in 2000s  Discussed the use of AI scribe software for clinical note transcription with the patient, who gave verbal consent to proceed.  History of Present Illness  I last saw the patient in 12/2020.  Since then, he has been primarily being followed by EP and A-fib clinic.  He was seen in A-fib clinic most recently in 05/2024.  Patient was hospitalized in 03/2024 with atrial flutter with RVR, possibly secondary to pneumonia.  He underwent cardioversion in 03/2024.  During office visit with A-fib clinic in 05/2024, he was in sinus rhythm.  Because of him feeling unwell with low heart rate, metoprolol  was stopped, but amiodarone  200 mg daily was continued, along with Eliquis  5 mg twice daily.  Since then, given amiodarone  has been discontinued due to slow heart rate and fatigue.  Since then, heart rate has picked up to about 50s to 60s at rest, and 80s with walking.  Patient has occasional episodes of exertional dyspnea, denies any chest pain.  He does report dizziness episodes, especially with standing up too quickly.  He drinks about 40 ounces of fluids daily.  Patient asks if he could go back to doing light workout in the gym.    Vitals:   07/13/24 1130  BP: 121/72  Pulse: (!) 54  SpO2: 98%      Review of Systems  Cardiovascular:  Positive for dyspnea on exertion. Negative for  chest pain, leg swelling, palpitations and syncope.  Neurological:  Positive for dizziness and light-headedness.        Studies Reviewed: SABRA        EKG 05/31/2024: Sinus bradycardia 45 bpm First-degree block Right bundle branch block Left ventricular septal block Bifascicular block  TEE 03/2024:  1. Left ventricular ejection fraction, by estimation, is 40 to 45%. The  left ventricle has mildly decreased function. The left ventricle  demonstrates global hypokinesis. The left ventricular internal cavity size  was mildly dilated.   2. Right ventricular systolic function is normal. The right ventricular  size is normal.   3. Left atrial size was severely dilated. No left atrial/left atrial  appendage thrombus was detected.   4. Right atrial size was mildly dilated.   5. The mitral valve is abnormal. Mild mitral valve regurgitation.   6. The aortic valve is tricuspid. There is moderate calcification of the  aortic valve. There is moderate thickening of the aortic valve. Aortic  valve regurgitation is not visualized. Aortic valve sclerosis is present,  with no evidence of aortic valve  stenosis.   7. Aortic dilatation noted. There is severe dilatation of the ascending  aorta, measuring 50 mm.   8. Not done and demonstrates None.   Labs 04/2024: Hb 10.0 Cr 1.5 TSH 6.8  06/2023: Chol 133, TG 55, HDL 59, LDL 62  Risk Assessment/Calculations:    CHA2DS2-VASc Score = 4  This indicates a 4.8% annual risk of stroke. The  patient's score is based upon: CHF History: 0 HTN History: 1 Diabetes History: 0 Stroke History: 0 Vascular Disease History: 1 Age Score: 2 Gender Score: 0    Physical Exam Vitals and nursing note reviewed.  Constitutional:      General: He is not in acute distress. Neck:     Vascular: No JVD.  Cardiovascular:     Rate and Rhythm: Normal rate and regular rhythm.     Heart sounds: Normal heart sounds. No murmur heard. Pulmonary:     Effort:  Pulmonary effort is normal.     Breath sounds: Normal breath sounds. No wheezing or rales.  Musculoskeletal:     Right lower leg: No edema.     Left lower leg: No edema.      VISIT DIAGNOSES:   ICD-10-CM   1. DOE (dyspnea on exertion)  R06.09 ECHOCARDIOGRAM COMPLETE    MYOCARDIAL PERFUSION IMAGING    2. Coronary artery disease involving native coronary artery of native heart with other form of angina pectoris  I25.118     3. Mixed hyperlipidemia  E78.2     4. Primary hypertension  I10     5. Aneurysm of ascending aorta without rupture  I71.21        Shaun Brown is a 89 y.o. male with with hypertension, hyperlipidemia, bioprosthetic aortic valve replacement (Carpentier-Edwards Perimount prosthetic valve size #25) and aortic aneurysm repair in 2017 [facility], [place] ), CAD s/p stent for MI in 2006, [place], h/o head and neck cancer s/p chemotherapy & radiation in 2000s Assessment & Plan  Dyspnea on exertion: Euvolemic on exam, but EF was reduced at 40-45% on recent TEE during cardioversion 03/2024.  Previously noted to have low normal EF.  Will repeat transthoracic echocardiogram now.  Given his prior CAD, also performed Lexiscan nuclear stress test.  CAD: Lipids well-controlled.  Not on beta-blocker or calcium  channel blocker due to low heart rate at rest. He was on aspirin  81 mg daily given CAD, as well as bioprosthetic aortic valve replacement.  However, he has had recent easy bruising and bleeding.  Given that he is now on Eliquis , will discontinue aspirin .  Paroxysmal atrial fibrillation: Currently maintaining sinus rhythm with rate in 50s without metoprolol  or amiodarone . Continue to monitor. Continue Eliquis  5 mg twice daily.  TAA: Prior aneurysm repair in 2017.  Aorta size was 5 cm in 2022, has stayed fairly stable. Given renal dysfunction, would avoid coronary CT angiogram and continue monitoring with echocardiogram.  Will consider  performing Lightheadedness: Possibly related due to inadequate hydration.  Increase fluid intake. Given that his resting heart rate is now increased to 50s, I doubt bradycardia as cause of his symptoms.    Informed Consent   Shared Decision Making/Informed Consent The risks [chest pain, shortness of breath, cardiac arrhythmias, dizziness, blood pressure fluctuations, myocardial infarction, stroke/transient ischemic attack, nausea, vomiting, allergic reaction, radiation exposure, metallic taste sensation and life-threatening complications (estimated to be 1 in 10,000)], benefits (risk stratification, diagnosing coronary artery disease, treatment guidance) and alternatives of a nuclear stress test were discussed in detail with Mr. Pettengill and he agrees to proceed.        F/u in 6-8 weeks  Signed, Newman JINNY Lawrence, MD

## 2024-07-25 ENCOUNTER — Other Ambulatory Visit (HOSPITAL_COMMUNITY): Payer: Self-pay | Admitting: *Deleted

## 2024-07-25 ENCOUNTER — Telehealth: Payer: Self-pay | Admitting: Cardiology

## 2024-07-25 ENCOUNTER — Encounter (INDEPENDENT_AMBULATORY_CARE_PROVIDER_SITE_OTHER): Admitting: Ophthalmology

## 2024-07-25 DIAGNOSIS — H43813 Vitreous degeneration, bilateral: Secondary | ICD-10-CM

## 2024-07-25 DIAGNOSIS — H348322 Tributary (branch) retinal vein occlusion, left eye, stable: Secondary | ICD-10-CM | POA: Diagnosis not present

## 2024-07-25 DIAGNOSIS — I10 Essential (primary) hypertension: Secondary | ICD-10-CM | POA: Diagnosis not present

## 2024-07-25 DIAGNOSIS — H35033 Hypertensive retinopathy, bilateral: Secondary | ICD-10-CM

## 2024-07-25 MED ORDER — METOPROLOL SUCCINATE ER 25 MG PO TB24: 25.0000 mg | ORAL_TABLET | Freq: Every day | ORAL | 3 refills | Status: DC

## 2024-07-25 NOTE — Telephone Encounter (Signed)
 Pt called reporting elevated HR 96-102 with BP 132/95. I spoke J. Terra P.A. he ordered Toprol  25 mg once daily. Pt agrees with plan and will continue monitor HR and BP.

## 2024-07-25 NOTE — Telephone Encounter (Signed)
 STAT if HR is under 50 or over 120  (normal HR is 60-100 beats per minute)  What is your heart rate?  99 bpm currently   Do you have a log of your heart rate readings (document readings)?  90-101 bpm at rest the past few days. Patient mentions 134/95 BP yesterday.  Do you have any other symptoms?  Tiredness

## 2024-07-25 NOTE — Telephone Encounter (Signed)
 Spoke with pt regarding his heart rate. Pt stated since he saw Dr. Elmira last, he has been having shortness of breath with slight activity as well as extreme fatigue. The pt was concerned about his blood pressure and heart rate. Blood pressure and heart rate while on the phone were 114/85 and 98. Pt stated he is also experiencing dizziness with movement such as standing up, even though he is being cautious to stand up slowly. Pt denies feeling any palpitations or chest pain. Pt stated he is taking all medications as prescribed. Pt was given ED precautions and told that Dr. Elmira would be notified. Pt verbalized understanding. All questions if any were answered.

## 2024-07-26 NOTE — Telephone Encounter (Signed)
 I called the patient and was able to schedule him to see Dr. Elmira on 07/27/24 at 1145.

## 2024-07-26 NOTE — Telephone Encounter (Signed)
 I wonder if he is back in Afib. May need to be seen and get an EKG. Okay to add as a double book or last patient on my schedule Wednesday or Friday this week if needed.  Thanks MJP

## 2024-07-27 ENCOUNTER — Ambulatory Visit: Attending: Cardiology | Admitting: Cardiology

## 2024-07-27 ENCOUNTER — Ambulatory Visit: Admitting: Cardiology

## 2024-07-27 ENCOUNTER — Encounter: Payer: Self-pay | Admitting: Cardiology

## 2024-07-27 VITALS — BP 122/84 | HR 101 | Wt 206.0 lb

## 2024-07-27 DIAGNOSIS — I480 Paroxysmal atrial fibrillation: Secondary | ICD-10-CM | POA: Insufficient documentation

## 2024-07-27 DIAGNOSIS — R0609 Other forms of dyspnea: Secondary | ICD-10-CM | POA: Insufficient documentation

## 2024-07-27 DIAGNOSIS — R600 Localized edema: Secondary | ICD-10-CM | POA: Insufficient documentation

## 2024-07-27 DIAGNOSIS — I4892 Unspecified atrial flutter: Secondary | ICD-10-CM | POA: Insufficient documentation

## 2024-07-27 DIAGNOSIS — I10 Essential (primary) hypertension: Secondary | ICD-10-CM | POA: Diagnosis present

## 2024-07-27 MED ORDER — METOPROLOL SUCCINATE ER 25 MG PO TB24: 25.0000 mg | ORAL_TABLET | Freq: Every day | ORAL | 3 refills | Status: AC

## 2024-07-27 MED ORDER — FUROSEMIDE 40 MG PO TABS: 40.0000 mg | ORAL_TABLET | Freq: Every day | ORAL | 3 refills | Status: AC

## 2024-07-27 NOTE — Patient Instructions (Signed)
 Medication Instructions:  START Lasix 40 mg daily   *If you need a refill on your cardiac medications before your next appointment, please call your pharmacy*  Lab Work: Cmp Probnp  If you have labs (blood work) drawn today and your tests are completely normal, you will receive your results only by: MyChart Message (if you have MyChart) OR A paper copy in the mail If you have any lab test that is abnormal or we need to change your treatment, we will call you to review the results.   Follow-Up: At [facility], you and your health needs are our priority.  As part of our continuing mission to provide you with exceptional heart care, our providers are all part of one team.  This team includes your primary Cardiologist (physician) and Advanced Practice Providers or APPs (Physician Assistants and Nurse Practitioners) who all work together to provide you with the care you need, when you need it.  Your next appointment:   Keep as scheduled   Provider:   Newman JINNY Lawrence, MD

## 2024-07-27 NOTE — Progress Notes (Signed)
 Cardiology Office Note:  .   Date:  07/27/2024  ID:  Shaun Brown, DOB 1931-12-31, MRN [id number] PCP: Shaun Brown ORN, MD  [facility] Providers Cardiologist:  Shaun Lawrence, MD PCP: Shaun Brown ORN, MD  Chief Complaint  Patient presents with   Atrial Flutter     Shaun Brown is a 89 y.o. male with hypertension, hyperlipidemia, bioprosthetic aortic valve replacement (Carpentier-Edwards Perimount prosthetic valve size #25) and aortic aneurysm repair in 2017 [facility], [place] ), CAD s/p stent for MI in 2006, [place], h/o head and neck cancer s/p chemotherapy & radiation in 2000s  Discussed the use of AI scribe software for clinical note transcription with the patient, who gave verbal consent to proceed.  History of Present Illness  Patient made today's appointment as his heart rate is back elevated around 100 bpm.  He has generalized fatigue, exertional dyspnea, and abdominal distention.  His wife has noticed that his color is not the same.    Vitals:   07/27/24 1152  BP: 122/84  Pulse: (!) 101  SpO2: 98%      Review of Systems  Cardiovascular:  Positive for dyspnea on exertion. Negative for chest pain, leg swelling, palpitations and syncope.  Neurological:  Positive for dizziness and light-headedness.        Studies Reviewed: SABRA        EKG 07/27/2024: Atypical atrial flutter with variable conduction Right bundle branch block Left anterior fascicular block Bifascicular block When compared with ECG of 31-May-2024 09:06,  atpyical atrial flutter has replaced Sinus rhythm Vent. rate has increased BY  55 BPM     EKG 05/31/2024: Sinus bradycardia 45 bpm First-degree block Right bundle branch block Left ventricular septal block Bifascicular block  TEE 03/2024:  1. Left ventricular ejection fraction, by estimation, is 40 to 45%. The  left ventricle has mildly decreased function. The left ventricle  demonstrates global hypokinesis. The  left ventricular internal cavity size  was mildly dilated.   2. Right ventricular systolic function is normal. The right ventricular  size is normal.   3. Left atrial size was severely dilated. No left atrial/left atrial  appendage thrombus was detected.   4. Right atrial size was mildly dilated.   5. The mitral valve is abnormal. Mild mitral valve regurgitation.   6. The aortic valve is tricuspid. There is moderate calcification of the  aortic valve. There is moderate thickening of the aortic valve. Aortic  valve regurgitation is not visualized. Aortic valve sclerosis is present,  with no evidence of aortic valve  stenosis.   7. Aortic dilatation noted. There is severe dilatation of the ascending  aorta, measuring 50 mm.   8. Not done and demonstrates None.   Labs 04/2024: Hb 10.0 Cr 1.5 TSH 6.8  06/2023: Chol 133, TG 55, HDL 59, LDL 62  Risk Assessment/Calculations:    CHA2DS2-VASc Score = 4  This indicates a 4.8% annual risk of stroke. The patient's score is based upon: CHF History: 0 HTN History: 1 Diabetes History: 0 Stroke History: 0 Vascular Disease History: 1 Age Score: 2 Gender Score: 0    Physical Exam Vitals and nursing note reviewed.  Constitutional:      General: He is not in acute distress. Neck:     Vascular: No JVD.  Cardiovascular:     Rate and Rhythm: Regular rhythm. Tachycardia present.     Heart sounds: Normal heart sounds. No murmur heard. Pulmonary:     Effort: Pulmonary effort is normal.  Breath sounds: Normal breath sounds. No wheezing or rales.  Musculoskeletal:     Right lower leg: Edema (1+) present.     Left lower leg: Edema (1+) present.      VISIT DIAGNOSES:   ICD-10-CM   1. Primary hypertension  I10 EKG 12-Lead    2. Paroxysmal atrial flutter (HCC)  I48.92     3. Paroxysmal atrial fibrillation (HCC)   I48.0     4. Exertional dyspnea  R06.09 Comprehensive metabolic panel with GFR    Pro b natriuretic peptide (BNP)     5. Leg edema  R60.0 Comprehensive metabolic panel with GFR    Pro b natriuretic peptide (BNP)       Shaun Brown is a 89 y.o. male with with hypertension, hyperlipidemia, bioprosthetic aortic valve replacement (Carpentier-Edwards Perimount prosthetic valve size #25) and aortic aneurysm repair in 2017 [facility], [place] ), CAD s/p stent for MI in 2006, [place], h/o head and neck cancer s/p chemotherapy & radiation 2000s Assessment & Plan  Paroxysmal atrial fibrillation/flutter: Back in atypical atrial flutter today with ventricular rate of about 100 bpm. He has resumed taking metoprolol  succinate 25 mg daily. With 2 prior cardioversions, I do not think his chances of maintenance of sinus rhythm are higher after another cardioversion. We discussed rate control/rhythm control options and decided to pursue rate control option. Continue metoprolol  succinate 25 mg daily. Continue Eliquis  5 mg twice daily.  Dyspnea on exertion: Mild bilateral leg edema, abdominal distention.  I suspect he has a competent of congestive heart failure.  Echocardiogram is pending.  I will check CMP and proBNP today and start Lasix 40 mg daily.    CAD: Pending Lexiscan nuclear stress test.   Lipids well-controlled. Not on aspirin  due to ongoing use of Eliquis .  TAA: Prior aneurysm repair in 2017.  Aorta size was 5 cm in 2022, has stayed fairly stable. Given renal dysfunction, would avoid coronary CT angiogram and continue monitoring with echocardiogram.   F/u in 6 weeks  Signed, Shaun JINNY Lawrence, MD

## 2024-07-28 ENCOUNTER — Ambulatory Visit: Payer: Self-pay | Admitting: Cardiology

## 2024-07-28 LAB — COMPREHENSIVE METABOLIC PANEL WITH GFR
ALT: 61 IU/L — ABNORMAL HIGH (ref 0–44)
AST: 39 IU/L (ref 0–40)
Albumin: 3.9 g/dL (ref 3.7–4.7)
Alkaline Phosphatase: 49 IU/L (ref 48–129)
BUN/Creatinine Ratio: 14 (ref 10–24)
BUN: 26 mg/dL (ref 8–27)
Bilirubin Total: 0.7 mg/dL (ref 0.0–1.2)
CO2: 23 mmol/L (ref 20–29)
Calcium: 9 mg/dL (ref 8.6–10.2)
Chloride: 99 mmol/L (ref 96–106)
Creatinine, Ser: 1.85 mg/dL — ABNORMAL HIGH (ref 0.76–1.27)
Globulin, Total: 2.8 g/dL (ref 1.5–4.5)
Glucose: 73 mg/dL (ref 70–99)
Potassium: 4.5 mmol/L (ref 3.5–5.2)
Sodium: 135 mmol/L (ref 134–144)
Total Protein: 6.7 g/dL (ref 6.0–8.5)
eGFR: 34 mL/min/1.73 — ABNORMAL LOW (ref >59)

## 2024-07-28 LAB — PRO B NATRIURETIC PEPTIDE: NT-Pro BNP: 4538 pg/mL — ABNORMAL HIGH (ref 0–486)

## 2024-07-28 NOTE — Progress Notes (Signed)
 proBNP significantly elevated, consistent with congestive heart failure. Creatinine has increased from 1.51 2 months ago, to 1.85 now, possible also due to congestion. Recommend taking Lasix 40 mg twice daily, as opposed to once daily as prescribed yesterday. If there is no improvement in the next few days, in terms of leg swelling, shortness of breath, fatigue, we could consider elective admission for IV diuresis. Please keep us  posted regarding your symptoms. Recommend repeating BMP and proBNP in 1 week.  Thanks MJP

## 2024-07-28 NOTE — Progress Notes (Signed)
 Please note the above message.  Thanks MJP

## 2024-07-29 ENCOUNTER — Other Ambulatory Visit: Payer: Self-pay

## 2024-07-29 ENCOUNTER — Telehealth: Payer: Self-pay | Admitting: Cardiology

## 2024-07-29 ENCOUNTER — Encounter (HOSPITAL_BASED_OUTPATIENT_CLINIC_OR_DEPARTMENT_OTHER): Payer: Self-pay

## 2024-07-29 ENCOUNTER — Emergency Department (HOSPITAL_BASED_OUTPATIENT_CLINIC_OR_DEPARTMENT_OTHER): Admitting: Radiology

## 2024-07-29 ENCOUNTER — Emergency Department (HOSPITAL_BASED_OUTPATIENT_CLINIC_OR_DEPARTMENT_OTHER)
Admission: EM | Admit: 2024-07-29 | Discharge: 2024-07-29 | Disposition: A | Discharge status: Home / Self Care | Source: Ambulatory Visit | Attending: Emergency Medicine | Admitting: Emergency Medicine

## 2024-07-29 DIAGNOSIS — Z7901 Long term (current) use of anticoagulants: Secondary | ICD-10-CM | POA: Insufficient documentation

## 2024-07-29 DIAGNOSIS — N179 Acute kidney failure, unspecified: Secondary | ICD-10-CM | POA: Insufficient documentation

## 2024-07-29 DIAGNOSIS — I4891 Unspecified atrial fibrillation: Secondary | ICD-10-CM | POA: Diagnosis not present

## 2024-07-29 DIAGNOSIS — R42 Dizziness and giddiness: Secondary | ICD-10-CM

## 2024-07-29 DIAGNOSIS — D649 Anemia, unspecified: Secondary | ICD-10-CM | POA: Insufficient documentation

## 2024-07-29 DIAGNOSIS — Z79899 Other long term (current) drug therapy: Secondary | ICD-10-CM | POA: Insufficient documentation

## 2024-07-29 DIAGNOSIS — I509 Heart failure, unspecified: Secondary | ICD-10-CM | POA: Insufficient documentation

## 2024-07-29 LAB — CBC
HCT: 26.6 % — ABNORMAL LOW (ref 39.0–52.0)
Hemoglobin: 8.5 g/dL — ABNORMAL LOW (ref 13.0–17.0)
MCH: 28 pg (ref 26.0–34.0)
MCHC: 32 g/dL (ref 30.0–36.0)
MCV: 87.5 fL (ref 80.0–100.0)
Platelets: 161 K/uL (ref 150–400)
RBC: 3.04 MIL/uL — ABNORMAL LOW (ref 4.22–5.81)
RDW: 17 % — ABNORMAL HIGH (ref 11.5–15.5)
WBC: 3.5 K/uL — ABNORMAL LOW (ref 4.0–10.5)
nRBC: 0 % (ref 0.0–0.2)

## 2024-07-29 LAB — BASIC METABOLIC PANEL WITH GFR
Anion gap: 10 (ref 5–15)
BUN: 31 mg/dL — ABNORMAL HIGH (ref 8–23)
CO2: 29 mmol/L (ref 22–32)
Calcium: 9.2 mg/dL (ref 8.9–10.3)
Chloride: 98 mmol/L (ref 98–111)
Creatinine, Ser: 2.08 mg/dL — ABNORMAL HIGH (ref 0.61–1.24)
GFR, Estimated: 30 mL/min — ABNORMAL LOW (ref >60)
Glucose, Bld: 81 mg/dL (ref 70–99)
Potassium: 3.9 mmol/L (ref 3.5–5.1)
Sodium: 137 mmol/L (ref 135–145)

## 2024-07-29 LAB — TROPONIN T, HIGH SENSITIVITY
Troponin T High Sensitivity: 84 ng/L — ABNORMAL HIGH (ref 0–19)
Troponin T High Sensitivity: 79 ng/L — ABNORMAL HIGH (ref 0–19)

## 2024-07-29 LAB — OCCULT BLOOD X 1 CARD TO LAB, STOOL: Fecal Occult Bld: NEGATIVE

## 2024-07-29 MED ORDER — SODIUM CHLORIDE 0.9 % IV BOLUS
500.0000 mL | Freq: Once | INTRAVENOUS | Status: AC
  Administered 2024-07-29: 500 mL via INTRAVENOUS

## 2024-07-29 NOTE — Telephone Encounter (Signed)
 Call sent straight to triage. Patient's has BP of 70/50 and HR 100 and he is having dizziness when standing and sitting. Asked patient's wife to call 911. Patient refusing to go the the ED or have EMS called. Patient's wife stated she would just take patient to the [facility] ED to get evaluated. Patient recently had an elevated Pro BNP at 4,538. Patient's lasix 40 mg daily was increased to BID. Informed patient's wife that patient might need hydration.

## 2024-07-29 NOTE — Discharge Instructions (Signed)
 You were seen in the emergency department for your dizziness.  We gave you some fluids in the ER and your blood pressure improved.  Your kidneys were mildly worsening function than your usual and you had mildly worsening anemia.  You had no signs of any blood in your stool.  Your heart enzyme was mildly elevated but unchanged when we repeated it meaning there is no ongoing stress to your heart.  I suspect that your symptoms were due to your increased dose of Lasix and you should decrease back to your normal dose to prevent further dehydration.  You should follow-up with your primary doctor or cardiologist in the next few days to have your symptoms and labs rechecked.  You should return to the emergency department if you are having worsening dizziness and pass out, you have severe chest pain or shortness of breath or any other new or concerning symptoms.

## 2024-07-29 NOTE — Telephone Encounter (Signed)
 Pt c/o BP issue: STAT if pt c/o blurred vision, one-sided weakness or slurred speech.   1. What is your BP concern?  BP is remaining low, in the 70's/50's.  2. Have you taken any BP medication today? Yes + Metoprolol  recently was increased to twice daily.  3. What are your last 5 BP readings? 71/53  4. Are you having any other symptoms (ex. Dizziness, headache, blurred vision, passed out)?  Dizziness

## 2024-07-29 NOTE — Telephone Encounter (Signed)
 You do not need to call the patient back.  I called and personally spoke with him.  He is currently at [facility] emergency room.  I suspect he may need hospitalization given his congestive heart failure, whether HFrEF or HFpEF, elevated creatinine, and hypotension with increasing dose of oral Lasix.  He may need closer monitoring with IV diuresis and echocardiogram, along with monitoring of his renal function and vitals.  If he gets echocardiogram in the hospital, we can cancel outpatient echocardiogram scheduled in December.  He could keep the stress test and follow-up with me in December, unless otherwise recommended during potential hospitalization now.  Thanks MJP

## 2024-07-29 NOTE — ED Provider Notes (Signed)
 [facility] AT [facility] Provider Note   CSN: [id number] Arrival date & time: 07/29/24  1320     Patient presents with: Hypotension   Shaun Brown is a 89 y.o. male.   Patient is an 89 year old male with a past medical history of CHF, A-fib on Eliquis  presenting to the emergency department with dizziness.  The patient states that he saw his primary doctor earlier in the week for increased swelling and dyspnea on exertion.  They increased his dose of Lasix and he states that he has lost about 4 to 5 pounds in the last 2 days.  He states when he woke up this morning he was very dizzy and felt like he was going to pass out.  He states that the dizziness persisted whether he was sitting or standing.  He denied any associated chest pain or shortness of breath.  He denies any recent black or bloody stools, nausea, vomiting or diarrhea.  The history is provided by the patient and the spouse.       Prior to Admission medications   Medication Sig Start Date End Date Taking? Authorizing Provider  acetaminophen  (TYLENOL ) 500 MG tablet Take 1,000 mg by mouth 2 (two) times daily as needed for fever, headache or moderate pain (pain score 4-6).    [provider]  Alcaftadine (LASTACAFT) 0.25 % SOLN Place 1 drop into both eyes daily as needed (eye irritation). 04/06/24   [provider]  apixaban  (ELIQUIS ) 5 MG TABS tablet Take 1 tablet (5 mg total) by mouth 2 (two) times daily. 04/05/24   Fairy Frames, MD  atorvastatin  (LIPITOR) 40 MG tablet Take 40 mg by mouth at bedtime. 11/04/18   [provider]  b complex vitamins tablet Take 1 tablet by mouth daily.    [provider]  benzonatate (TESSALON) 200 MG capsule Take 200 mg by mouth 3 (three) times daily as needed for cough. 03/23/24   [provider]  Calcium  Carb-Cholecalciferol (CALCIUM  + VITAMIN D3 PO) Take 1 tablet by mouth daily.    [provider]   Cholecalciferol (VITAMIN D-3 PO) Take 1 capsule by mouth daily. Patient not taking: Reported on 07/27/2024    [provider]  furosemide (LASIX) 40 MG tablet Take 1 tablet (40 mg total) by mouth daily. 07/27/24   Patwardhan, Newman PARAS, MD  losartan  (COZAAR ) 25 MG tablet Take 1 tablet (25 mg total) by mouth daily. 06/30/24   Terra Fairy PARAS, PA-C  metoprolol  succinate (TOPROL  XL) 25 MG 24 hr tablet Take 1 tablet (25 mg total) by mouth daily. 07/27/24   Patwardhan, Newman PARAS, MD  nitroGLYCERIN  (NITROSTAT ) 0.4 MG SL tablet Place 1 tablet (0.4 mg total) under the tongue every 5 (five) minutes as needed for chest pain. 09/18/20 07/27/24  Patwardhan, Newman PARAS, MD  omeprazole (PRILOSEC) 20 MG capsule Take 20 mg by mouth daily. 11/22/18   [provider]  sodium fluoride (DENTAGEL) 1.1 % GEL dental gel Place 1 Application onto teeth at bedtime.    [provider]    Allergies: Bovine (beef) protein-containing drug products, Chicken allergy, Porcine (pork) protein-containing drug products, Tetanus toxoid-containing vaccines, and Amoxil [amoxicillin]    Review of Systems  Updated Vital Signs BP (!) 118/101   Pulse 100   Temp 97.6 F (36.4 C) (Oral)   Resp 17   SpO2 98%   Physical Exam Vitals and nursing note reviewed.  Constitutional:      General: He is not in  acute distress.    Appearance: Normal appearance.  HENT:     Head: Normocephalic and atraumatic.     Nose: Nose normal.     Mouth/Throat:     Mouth: Mucous membranes are moist.  Eyes:     Extraocular Movements: Extraocular movements intact.     Conjunctiva/sclera: Conjunctivae normal.  Cardiovascular:     Rate and Rhythm: Normal rate and regular rhythm.     Heart sounds: Normal heart sounds.  Pulmonary:     Effort: Pulmonary effort is normal.     Breath sounds: Normal breath sounds.  Abdominal:     General: Abdomen is flat.     Palpations: Abdomen is soft.  Musculoskeletal:        General: Normal  range of motion.     Cervical back: Normal range of motion.     Right lower leg: Edema (2+ to knee) present.     Left lower leg: Edema (2+ to knee) present.  Skin:    General: Skin is warm and dry.  Neurological:     General: No focal deficit present.     Mental Status: He is alert and oriented to person, place, and time.  Psychiatric:        Mood and Affect: Mood normal.        Behavior: Behavior normal.     (all labs ordered are listed, but only abnormal results are displayed) Labs Reviewed  BASIC METABOLIC PANEL WITH GFR - Abnormal; Notable for the following components:      Result Value   BUN 31 (*)    Creatinine, Ser 2.08 (*)    GFR, Estimated 30 (*)    All other components within normal limits  CBC - Abnormal; Notable for the following components:   WBC 3.5 (*)    RBC 3.04 (*)    Hemoglobin 8.5 (*)    HCT 26.6 (*)    RDW 17.0 (*)    All other components within normal limits  TROPONIN T, HIGH SENSITIVITY - Abnormal; Notable for the following components:   Troponin T High Sensitivity 84 (*)    All other components within normal limits  TROPONIN T, HIGH SENSITIVITY - Abnormal; Notable for the following components:   Troponin T High Sensitivity 79 (*)    All other components within normal limits  OCCULT BLOOD X 1 CARD TO LAB, STOOL    EKG: EKG Interpretation Date/Time:  Friday July 29 2024 13:32:53 EST Ventricular Rate:  102 PR Interval:    QRS Duration:  134 QT Interval:  404 QTC Calculation: 526 R Axis:   -70  Text Interpretation: Atrial flutter Left axis deviation Non-specific intra-ventricular conduction block Cannot rule out Anterior infarct , age undetermined Abnormal ECG No significant change since last tracing Confirmed by Ellouise Fine ([id number]) on 07/29/2024 4:11:20 PM  Radiology: DG Chest 2 View Result Date: 07/29/2024 EXAM: 2 VIEW(S) XRAY OF THE CHEST 07/29/2024 02:06:18 PM COMPARISON: 03/30/2024 CLINICAL HISTORY: dizzy FINDINGS: LINES, TUBES  AND DEVICES: Implanted loop recorder in left chest. LUNGS AND PLEURA: Low lung volumes with crowding of perihilar bronchovascular structures. No pleural effusion. No pneumothorax. HEART AND MEDIASTINUM: Heart size at upper limits of normal. Aortic valve replacement. CABG markers noted. Aortic atherosclerosis. BONES AND SOFT TISSUES: Sternotomy wires noted. No acute osseous abnormality. IMPRESSION: 1. No acute cardiopulmonary process. 2. Aortic Atherosclerosis (ICD10-I70.0). Electronically signed by: Ryan Chess MD 07/29/2024 02:33 PM EST RP Workstation: [id number]     Procedures   Medications Ordered in the  ED  sodium chloride  0.9 % bolus 500 mL (500 mLs Intravenous New Bag/Given 07/29/24 1703)    Clinical Course as of 07/29/24 2131  Fri Jul 29, 2024  1805 Hemoccult negative, rpt trop pending. [VK]  1945 Repeat troponin downtrending. Will get orthostatics to determine disposition. [VK]  2033 Orthostatics negative. Patient asymptomatic. He is stable for discharge home. Recommended to decrease dose of lasix and close PCP or cardiology follow up. [VK]    Clinical Course User Index [VK] Kingsley, Jocilynn Grade K, DO                                 Medical Decision Making This patient presents to the ED with chief complaint(s) of dizziness with pertinent past medical history of CHF, A-fib on Eliquis  which further complicates the presenting complaint. The complaint involves an extensive differential diagnosis and also carries with it a high risk of complications and morbidity.    The differential diagnosis includes arrhythmia, anemia, dehydration, electrolyte abnormality, orthostatic hypotension  Additional history obtained: Additional history obtained from spouse Records reviewed Primary Care Documents  ED Course and Reassessment: On patient's arrival initial blood pressure was soft in the 90s and improved to the 130s systolic upon arrival lying supine in the room.  Patient had EKG on arrival  that showed rate controlled atrial or unchanged from his prior and had labs initiated in triage.  Troponin was mildly elevated at 84, I suspect demand as he has no symptoms of ACS.  He does have an AKI with a creatinine of 2 from his baseline of around 1.8 and acute anemia with a hemoglobin of 8.5 from his baseline of around 10.  Patient will need repeat troponin and Hemoccult performed.  Will be given gentle fluids and will be closely reassessed.  Independent labs interpretation:  The following labs were independently interpreted: acute on chronic anemia, AKI on CKD  Independent visualization of imaging: - I independently visualized the following imaging with scope of interpretation limited to determining acute life threatening conditions related to emergency care: CXR, which revealed no acute disease  Consultation: - Consulted or discussed management/test interpretation w/ external professional: N/A  Consideration for admission or further workup: Patient has no emergent conditions requiring admission or further work-up at this time and is stable for discharge home with primary care follow-up  Social Determinants of health: N/A    Amount and/or Complexity of Data Reviewed Labs: ordered. Radiology: ordered.       Final diagnoses:  Dizziness  AKI (acute kidney injury)  Anemia, unspecified type    ED Discharge Orders     None          Kingsley, Oley Lahaie K, DO 07/29/24 2131

## 2024-07-29 NOTE — ED Triage Notes (Signed)
 Patient reports hypotension 1 hour ago. He was dizzy while seated. At home it was 71/50. Patient is ambulatory here and BP is soft but stable here. He said he got a double dose of Lasix starting yesterday.

## 2024-08-01 ENCOUNTER — Encounter: Payer: Self-pay | Admitting: Cardiology

## 2024-08-01 ENCOUNTER — Encounter

## 2024-08-01 ENCOUNTER — Ambulatory Visit: Attending: Cardiology | Admitting: Cardiology

## 2024-08-01 ENCOUNTER — Telehealth: Payer: Self-pay | Admitting: Cardiology

## 2024-08-01 VITALS — BP 90/72 | HR 102 | Ht 68.0 in | Wt 201.0 lb

## 2024-08-01 DIAGNOSIS — I5020 Unspecified systolic (congestive) heart failure: Secondary | ICD-10-CM | POA: Diagnosis not present

## 2024-08-01 DIAGNOSIS — I4892 Unspecified atrial flutter: Secondary | ICD-10-CM | POA: Insufficient documentation

## 2024-08-01 DIAGNOSIS — I7121 Aneurysm of the ascending aorta, without rupture: Secondary | ICD-10-CM | POA: Diagnosis present

## 2024-08-01 NOTE — Telephone Encounter (Signed)
 If unsteady on standing up, recommend calling 911 and go to ER again. If BP improves and steady on standing up, I will see him in office today (can head over now, creat an appt for 1:00 PM  but I will see him whatever time he is here). I do think we need more definitive management for this recurrent issue and high likelihood that would only happen with hospitalization.  Thanks MJP   Thanks MJP

## 2024-08-01 NOTE — Telephone Encounter (Signed)
 Appt scheduled for today.

## 2024-08-01 NOTE — Telephone Encounter (Signed)
 Pt c/o BP issue: STAT if pt c/o blurred vision, one-sided weakness or slurred speech  1. What are your last 5 BP readings? 64/51, 67/52, and 68/51 today   2. Are you having any other symptoms (ex. Dizziness, headache, blurred vision, passed out)? Dizziness and blurred vision  3. What is your BP issue? Pt's wife is very concerned about his bp being this low after being seen in the ED on Friday from. Please advise

## 2024-08-01 NOTE — Telephone Encounter (Signed)
 Wife called  stating  patient blood pressure is low again ,    He went to  [facility]  arrived @2PM  on Friday for the same reason. Patient received IV fliuds and was released @ 10 pm.    She states - he informed her he was dizzy and had blurred vision  Checked b/p 11:40 am til now. - 64/51, 68/51,80/?   Now 79/62  heart rate on 99.    She states  blood pressure earlier was 118/? prior to morning medications. He did take his Metoprolol   XL 50 mg, Lasix 20 mg , Losartan  25 mg , as well as other non cardiac medications.   RN can hear patient in the back ground giving his wife instruction and information .     RN informed Mrs Blizzard, will defer to Dr Ronnie a review and response.

## 2024-08-01 NOTE — Patient Instructions (Signed)
 Medication Instructions:  STOP Losartan  25mg  *If you need a refill on your cardiac medications before your next appointment, please call your pharmacy*  Lab Work: BMP, Pro BNP (same day as Echo 08/22/24)  If you have labs (blood work) drawn today and your tests are completely normal, you will receive your results only by: MyChart Message (if you have MyChart) OR A paper copy in the mail If you have any lab test that is abnormal or we need to change your treatment, we will call you to review the results.  Testing/Procedures: None  Follow-Up: At [facility], you and your health needs are our priority.  As part of our continuing mission to provide you with exceptional heart care, our providers are all part of one team.  This team includes your primary Cardiologist (physician) and Advanced Practice Providers or APPs (Physician Assistants and Nurse Practitioners) who all work together to provide you with the care you need, when you need it.  Your next appointment:   Keep 12/21 appointment  Provider:   Newman JINNY Lawrence, MD

## 2024-08-01 NOTE — Progress Notes (Addendum)
 Cardiology Office Note:  .   Date:  08/01/2024  ID:  Shaun Brown, DOB 12/25/1931, MRN [id number] PCP: Vernadine Charlie ORN, MD  [facility] Providers Cardiologist:  Newman Lawrence, MD PCP: Vernadine Charlie ORN, MD  Chief Complaint  Patient presents with   Hypotension          Shaun Brown is a 89 y.o. male with hypertension, hyperlipidemia, bioprosthetic aortic valve replacement (Carpentier-Edwards Perimount prosthetic valve size #25) and aortic aneurysm repair in 2017 [facility], [place] ), CAD s/p stent for MI in 2006, [place], h/o head and neck cancer s/p chemotherapy & radiation in 2000s  History of Present Illness  Since his last visit with me, patient has had episodes of low blood pressure, with at least 1 ER visit.  Blood pressure varies during different times of the day, has been as low as systolic blood pressure in 70s.  He has noticed increased diuresis, improvement in leg swelling and weight gain.   Vitals:   08/01/24 1342  BP: 90/72  Pulse: (!) 102  SpO2: 97%      Review of Systems  Cardiovascular:  Positive for dyspnea on exertion. Negative for chest pain, leg swelling, palpitations and syncope.  Neurological:  Positive for dizziness and light-headedness.        Studies Reviewed: SABRA        EKG 07/27/2024: Atypical atrial flutter with variable conduction Right bundle branch block Left anterior fascicular block Bifascicular block When compared with ECG of 31-May-2024 09:06,  atpyical atrial flutter has replaced Sinus rhythm Vent. rate has increased BY  55 BPM     EKG 05/31/2024: Sinus bradycardia 45 bpm First-degree block Right bundle branch block Left ventricular septal block Bifascicular block  TEE 03/2024:  1. Left ventricular ejection fraction, by estimation, is 40 to 45%. The  left ventricle has mildly decreased function. The left ventricle  demonstrates global hypokinesis. The left ventricular internal cavity size  was mildly  dilated.   2. Right ventricular systolic function is normal. The right ventricular  size is normal.   3. Left atrial size was severely dilated. No left atrial/left atrial  appendage thrombus was detected.   4. Right atrial size was mildly dilated.   5. The mitral valve is abnormal. Mild mitral valve regurgitation.   6. The aortic valve is tricuspid. There is moderate calcification of the  aortic valve. There is moderate thickening of the aortic valve. Aortic  valve regurgitation is not visualized. Aortic valve sclerosis is present,  with no evidence of aortic valve  stenosis.   7. Aortic dilatation noted. There is severe dilatation of the ascending  aorta, measuring 50 mm.   8. Not done and demonstrates None.   Labs 04/2024: Hb 10.0 Cr 1.5 TSH 6.8  06/2023: Chol 133, TG 55, HDL 59, LDL 62  Risk Assessment/Calculations:    CHA2DS2-VASc Score = 4  This indicates a 4.8% annual risk of stroke. The patient's score is based upon: CHF History: 0 HTN History: 1 Diabetes History: 0 Stroke History: 0 Vascular Disease History: 1 Age Score: 2 Gender Score: 0    Physical Exam Vitals and nursing note reviewed.  Constitutional:      General: He is not in acute distress. Neck:     Vascular: No JVD.  Cardiovascular:     Rate and Rhythm: Regular rhythm. Tachycardia present.     Heart sounds: Normal heart sounds. No murmur heard. Pulmonary:     Effort: Pulmonary effort is normal.  Breath sounds: Normal breath sounds. No wheezing or rales.  Musculoskeletal:     Right lower leg: No edema.     Left lower leg: No edema.      VISIT DIAGNOSES:   ICD-10-CM   1. Heart failure with mildly reduced ejection fraction (HCC)  I50.20 Basic metabolic panel with GFR    Pro b natriuretic peptide (BNP)    Pro b natriuretic peptide (BNP)    Basic metabolic panel with GFR    2. Paroxysmal atrial flutter (HCC)  I48.92     3. Aneurysm of ascending aorta without rupture  I71.21          Shaun Brown is a 89 y.o. male with with hypertension, hyperlipidemia, bioprosthetic aortic valve replacement (Carpentier-Edwards Perimount prosthetic valve size #25) and aortic aneurysm repair in 2017 [facility], [place] ), CAD s/p stent for MI in 2006, [place], h/o head and neck cancer s/p chemotherapy & radiation 2000s Assessment & Plan  Paroxysmal atrial fibrillation/flutter: Controlled rate.   With 2 prior cardioversions, I do not think his chances of maintenance of sinus rhythm are higher after another cardioversion. We discussed rate control/rhythm control options and decided to pursue rate control option. Continue metoprolol  succinate 25 mg daily. Continue Eliquis  5 mg twice daily.  HFmrEF: EF 40-45% on TEE. Transthoracic echocardiogram pending. Given low normal blood pressures, discontinued losartan . Continue Lasix 40 mg daily, metoprolol  succinate 25 mg daily. Continue regular home checks. Upcoming echocardiogram and stress tests on 08/22/2024.  Check BMP and proBNP on that day.  TAA: Prior aneurysm repair in 2017.  Aorta size was 5 cm in 2022, has stayed fairly stable. Given renal dysfunction, would avoid coronary CT angiogram and continue monitoring with echocardiogram.   F/u in 6 weeks  Signed, Newman JINNY Lawrence, MD

## 2024-08-04 ENCOUNTER — Ambulatory Visit: Attending: Cardiology

## 2024-08-04 DIAGNOSIS — I4892 Unspecified atrial flutter: Secondary | ICD-10-CM | POA: Diagnosis not present

## 2024-08-04 LAB — CUP PACEART REMOTE DEVICE CHECK
Date Time Interrogation Session: 20251119230946
Implantable Pulse Generator Implant Date: 20220328

## 2024-08-08 NOTE — Progress Notes (Signed)
 Remote Loop Recorder Transmission

## 2024-08-10 ENCOUNTER — Ambulatory Visit: Payer: Self-pay | Admitting: Cardiology

## 2024-08-17 ENCOUNTER — Telehealth (HOSPITAL_COMMUNITY): Payer: Self-pay | Admitting: *Deleted

## 2024-08-17 NOTE — Telephone Encounter (Signed)
 Patient given detailed instructions per Myocardial Perfusion Study Information Sheet for the test on 08/22/2024 at 10:15. Patient notified to arrive 15 minutes early and that it is imperative to arrive on time for appointment to keep from having the test rescheduled.  If you need to cancel or reschedule your appointment, please call the office within 24 hours of your appointment. . Patient verbalized understanding.Shaun Brown

## 2024-08-18 ENCOUNTER — Other Ambulatory Visit: Payer: Self-pay | Admitting: Cardiology

## 2024-08-18 DIAGNOSIS — R0609 Other forms of dyspnea: Secondary | ICD-10-CM

## 2024-08-22 ENCOUNTER — Ambulatory Visit (HOSPITAL_COMMUNITY)
Admission: RE | Admit: 2024-08-22 | Discharge: 2024-08-22 | Disposition: A | Source: Ambulatory Visit | Attending: Cardiology

## 2024-08-22 ENCOUNTER — Ambulatory Visit (HOSPITAL_COMMUNITY): Admission: RE | Admit: 2024-08-22 | Discharge: 2024-08-22 | Attending: Cardiology

## 2024-08-22 ENCOUNTER — Ambulatory Visit: Payer: Self-pay | Admitting: Cardiology

## 2024-08-22 DIAGNOSIS — R0609 Other forms of dyspnea: Secondary | ICD-10-CM

## 2024-08-22 LAB — MYOCARDIAL PERFUSION IMAGING
Base ST Depression (mm): 0 mm
LV dias vol: 92 mL (ref 62–150)
LV sys vol: 64 mL (ref 4.2–5.8)
Nuc Stress EF: 41 %
Peak HR: 103 {beats}/min
Rest HR: 100 {beats}/min
Rest Nuclear Isotope Dose: 10.9 mCi
SDS: 0
SRS: 3
SSS: 2
ST Depression (mm): 0 mm
Stress Nuclear Isotope Dose: 30.9 mCi
TID: 0.85

## 2024-08-22 LAB — ECHOCARDIOGRAM COMPLETE
AR max vel: 1.47 cm2
AV Area VTI: 1.52 cm2
AV Area mean vel: 1.44 cm2
AV Mean grad: 9 mmHg
AV Peak grad: 15.8 mmHg
Ao pk vel: 1.99 m/s
Calc EF: 49.3 %
MV M vel: 5.55 m/s
MV Peak grad: 123.2 mmHg
S' Lateral: 2.5 cm
Single Plane A2C EF: 47.4 %
Single Plane A4C EF: 49.6 %

## 2024-08-22 MED ORDER — TECHNETIUM TC 99M TETROFOSMIN IV KIT
30.9000 | PACK | Freq: Once | INTRAVENOUS | Status: AC | PRN
  Administered 2024-08-22: 30.9 via INTRAVENOUS

## 2024-08-22 MED ORDER — REGADENOSON 0.4 MG/5ML IV SOLN
0.4000 mg | Freq: Once | INTRAVENOUS | Status: AC
  Administered 2024-08-22: 0.4 mg via INTRAVENOUS

## 2024-08-22 MED ORDER — REGADENOSON 0.4 MG/5ML IV SOLN
INTRAVENOUS | Status: AC
  Filled 2024-08-22: qty 5

## 2024-08-22 MED ORDER — TECHNETIUM TC 99M TETROFOSMIN IV KIT
10.9000 | PACK | Freq: Once | INTRAVENOUS | Status: AC | PRN
  Administered 2024-08-22: 10.9 via INTRAVENOUS

## 2024-08-23 ENCOUNTER — Ambulatory Visit: Payer: Self-pay | Admitting: Cardiology

## 2024-08-23 LAB — BASIC METABOLIC PANEL WITH GFR
BUN/Creatinine Ratio: 16 (ref 10–24)
BUN: 30 mg/dL (ref 10–36)
CO2: 26 mmol/L (ref 20–29)
Calcium: 9.3 mg/dL (ref 8.6–10.2)
Chloride: 100 mmol/L (ref 96–106)
Creatinine, Ser: 1.89 mg/dL — ABNORMAL HIGH (ref 0.76–1.27)
Glucose: 94 mg/dL (ref 70–99)
Potassium: 4 mmol/L (ref 3.5–5.2)
Sodium: 137 mmol/L (ref 134–144)
eGFR: 33 mL/min/1.73 — ABNORMAL LOW (ref >59)

## 2024-08-23 LAB — PRO B NATRIURETIC PEPTIDE: NT-Pro BNP: 2799 pg/mL — AB (ref 0–486)

## 2024-09-01 ENCOUNTER — Encounter

## 2024-09-04 ENCOUNTER — Ambulatory Visit

## 2024-09-04 DIAGNOSIS — I4892 Unspecified atrial flutter: Secondary | ICD-10-CM

## 2024-09-05 ENCOUNTER — Ambulatory Visit: Admitting: Cardiology

## 2024-09-06 LAB — CUP PACEART REMOTE DEVICE CHECK
Date Time Interrogation Session: 20251220230440
Implantable Pulse Generator Implant Date: 20220328

## 2024-09-07 NOTE — Telephone Encounter (Signed)
 Pts wife calling concerned about his recent echo results. She would like to know if his 10/10/24 appt is appropriate, or if Dr. Elmira thinks he needs to be seen sooner. Please advise.

## 2024-09-07 NOTE — Progress Notes (Signed)
 Remote Loop Recorder Transmission

## 2024-09-17 ENCOUNTER — Ambulatory Visit: Payer: Self-pay | Admitting: Cardiology

## 2024-10-02 ENCOUNTER — Encounter

## 2024-10-03 ENCOUNTER — Encounter

## 2024-10-05 ENCOUNTER — Ambulatory Visit

## 2024-10-05 DIAGNOSIS — I4892 Unspecified atrial flutter: Secondary | ICD-10-CM | POA: Diagnosis not present

## 2024-10-06 ENCOUNTER — Ambulatory Visit: Payer: Self-pay | Admitting: Cardiology

## 2024-10-06 LAB — CUP PACEART REMOTE DEVICE CHECK
Date Time Interrogation Session: 20260120230752
Implantable Pulse Generator Implant Date: 20220328

## 2024-10-08 NOTE — Progress Notes (Signed)
 Remote Loop Recorder Transmission

## 2024-10-10 ENCOUNTER — Ambulatory Visit: Admitting: Cardiology

## 2024-11-02 ENCOUNTER — Encounter

## 2024-11-03 ENCOUNTER — Encounter

## 2024-11-05 ENCOUNTER — Ambulatory Visit

## 2024-11-21 ENCOUNTER — Ambulatory Visit: Admitting: Emergency Medicine

## 2024-11-28 ENCOUNTER — Ambulatory Visit (HOSPITAL_COMMUNITY): Admitting: Internal Medicine

## 2024-12-03 ENCOUNTER — Encounter

## 2024-12-05 ENCOUNTER — Encounter

## 2024-12-06 ENCOUNTER — Ambulatory Visit

## 2024-12-12 ENCOUNTER — Encounter (INDEPENDENT_AMBULATORY_CARE_PROVIDER_SITE_OTHER): Admitting: Ophthalmology

## 2025-01-03 ENCOUNTER — Encounter

## 2025-01-05 ENCOUNTER — Encounter

## 2025-01-06 ENCOUNTER — Ambulatory Visit

## 2025-02-06 ENCOUNTER — Ambulatory Visit

## 2025-03-09 ENCOUNTER — Ambulatory Visit

## 2025-04-09 ENCOUNTER — Ambulatory Visit

## 2025-05-10 ENCOUNTER — Ambulatory Visit

## 2025-06-10 ENCOUNTER — Ambulatory Visit

## 2025-07-11 ENCOUNTER — Ambulatory Visit

## 2025-08-11 ENCOUNTER — Ambulatory Visit

## 2025-09-11 ENCOUNTER — Ambulatory Visit
